# Patient Record
Sex: Female | Born: 1970 | Race: White | Hispanic: No | Marital: Married | Smoking: Never smoker
Health system: Southern US, Community
[De-identification: ages and names within clinical notes are randomized; demographics above are authoritative.]

## PROBLEM LIST (undated history)

## (undated) DIAGNOSIS — E559 Vitamin D deficiency, unspecified: Secondary | ICD-10-CM

## (undated) DIAGNOSIS — R6889 Other general symptoms and signs: Secondary | ICD-10-CM

## (undated) DIAGNOSIS — E079 Disorder of thyroid, unspecified: Secondary | ICD-10-CM

## (undated) DIAGNOSIS — E785 Hyperlipidemia, unspecified: Secondary | ICD-10-CM

## (undated) DIAGNOSIS — E663 Overweight: Secondary | ICD-10-CM

## (undated) DIAGNOSIS — N189 Chronic kidney disease, unspecified: Secondary | ICD-10-CM

## (undated) DIAGNOSIS — R87629 Unspecified abnormal cytological findings in specimens from vagina: Secondary | ICD-10-CM

## (undated) DIAGNOSIS — K5900 Constipation, unspecified: Secondary | ICD-10-CM

## (undated) HISTORY — DX: Disorder of thyroid, unspecified: E07.9

## (undated) HISTORY — DX: Chronic kidney disease, unspecified: N18.9

## (undated) HISTORY — PX: CHOLECYSTECTOMY: SHX55

## (undated) HISTORY — PX: MOUTH SURGERY: SHX715

## (undated) HISTORY — DX: Vitamin D deficiency, unspecified: E55.9

## (undated) HISTORY — DX: Unspecified abnormal cytological findings in specimens from vagina: R87.629

## (undated) HISTORY — PX: LEEP: SHX91

---
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
## 1997-07-29 ENCOUNTER — Other Ambulatory Visit: Admission: RE | Admit: 1997-07-29 | Discharge: 1997-07-29 | Payer: Self-pay | Admitting: Gynecology

## 1998-08-24 ENCOUNTER — Other Ambulatory Visit: Admission: RE | Admit: 1998-08-24 | Discharge: 1998-08-24 | Payer: Self-pay | Admitting: Gynecology

## 2000-07-20 ENCOUNTER — Other Ambulatory Visit: Admission: RE | Admit: 2000-07-20 | Discharge: 2000-07-20 | Payer: Self-pay | Admitting: Gynecology

## 2006-09-18 ENCOUNTER — Other Ambulatory Visit: Admission: RE | Admit: 2006-09-18 | Discharge: 2006-09-18 | Payer: Self-pay | Admitting: Obstetrics and Gynecology

## 2007-10-30 ENCOUNTER — Other Ambulatory Visit: Admission: RE | Admit: 2007-10-30 | Discharge: 2007-10-30 | Payer: Self-pay | Admitting: Obstetrics and Gynecology

## 2007-10-30 ENCOUNTER — Ambulatory Visit (HOSPITAL_COMMUNITY): Admission: RE | Admit: 2007-10-30 | Discharge: 2007-10-30 | Payer: Self-pay | Admitting: Obstetrics & Gynecology

## 2007-11-06 ENCOUNTER — Encounter (INDEPENDENT_AMBULATORY_CARE_PROVIDER_SITE_OTHER): Payer: Self-pay | Admitting: General Surgery

## 2007-11-06 ENCOUNTER — Ambulatory Visit (HOSPITAL_COMMUNITY): Admission: RE | Admit: 2007-11-06 | Discharge: 2007-11-07 | Payer: Self-pay | Admitting: General Surgery

## 2008-12-11 ENCOUNTER — Other Ambulatory Visit: Admission: RE | Admit: 2008-12-11 | Discharge: 2008-12-11 | Payer: Self-pay | Admitting: Obstetrics and Gynecology

## 2010-01-30 ENCOUNTER — Emergency Department (HOSPITAL_COMMUNITY)
Admission: EM | Admit: 2010-01-30 | Discharge: 2010-01-30 | Payer: Self-pay | Source: Home / Self Care | Admitting: Emergency Medicine

## 2010-01-30 LAB — COMPREHENSIVE METABOLIC PANEL
ALT: 73 U/L — ABNORMAL HIGH (ref 0–35)
AST: 144 U/L — ABNORMAL HIGH (ref 0–37)
Albumin: 3.8 g/dL (ref 3.5–5.2)
Alkaline Phosphatase: 85 U/L (ref 39–117)
BUN: 6 mg/dL (ref 6–23)
CO2: 24 mEq/L (ref 19–32)
Calcium: 8.9 mg/dL (ref 8.4–10.5)
Chloride: 103 mEq/L (ref 96–112)
Creatinine, Ser: 0.68 mg/dL (ref 0.4–1.2)
GFR calc Af Amer: >60 mL/min (ref >60)
GFR calc non Af Amer: >60 mL/min (ref >60)
Glucose, Bld: 99 mg/dL (ref 70–99)
Potassium: 3.3 mEq/L — ABNORMAL LOW (ref 3.5–5.1)
Sodium: 137 mEq/L (ref 135–145)
Total Bilirubin: 0.4 mg/dL (ref 0.3–1.2)
Total Protein: 7.3 g/dL (ref 6.0–8.3)

## 2010-01-30 LAB — URINALYSIS, ROUTINE W REFLEX MICROSCOPIC
Bilirubin Urine: NEGATIVE
Hgb urine dipstick: NEGATIVE
Ketones, ur: NEGATIVE mg/dL
Nitrite: NEGATIVE
Protein, ur: NEGATIVE mg/dL
Specific Gravity, Urine: <1.005 — ABNORMAL LOW (ref 1.005–1.030)
Urine Glucose, Fasting: NEGATIVE mg/dL
Urobilinogen, UA: 0.2 mg/dL (ref 0.0–1.0)
pH: 6 (ref 5.0–8.0)

## 2010-01-30 LAB — CBC
HCT: 39.7 % (ref 36.0–46.0)
Hemoglobin: 14 g/dL (ref 12.0–15.0)
MCH: 30.4 pg (ref 26.0–34.0)
MCHC: 35.3 g/dL (ref 30.0–36.0)
MCV: 86.1 fL (ref 78.0–100.0)
Platelets: 165 10*3/uL (ref 150–400)
RBC: 4.61 MIL/uL (ref 3.87–5.11)
RDW: 12.5 % (ref 11.5–15.5)
WBC: 7.1 10*3/uL (ref 4.0–10.5)

## 2010-01-30 LAB — DIFFERENTIAL
Basophils Absolute: 0 10*3/uL (ref 0.0–0.1)
Basophils Relative: 0 % (ref 0–1)
Eosinophils Absolute: 0.1 10*3/uL (ref 0.0–0.7)
Eosinophils Relative: 1 % (ref 0–5)
Lymphocytes Relative: 16 % (ref 12–46)
Lymphs Abs: 1.1 10*3/uL (ref 0.7–4.0)
Monocytes Absolute: 0.3 10*3/uL (ref 0.1–1.0)
Monocytes Relative: 4 % (ref 3–12)
Neutro Abs: 5.6 10*3/uL (ref 1.7–7.7)
Neutrophils Relative %: 79 % — ABNORMAL HIGH (ref 43–77)

## 2010-01-30 LAB — LIPASE, BLOOD: Lipase: 103 U/L — ABNORMAL HIGH (ref 11–59)

## 2010-01-30 LAB — PREGNANCY, URINE: Preg Test, Ur: NEGATIVE

## 2010-05-17 NOTE — Op Note (Signed)
Kimberly Brennan, Kimberly Brennan              ACCOUNT NO.:  [id number]   MEDICAL RECORD NO.:  [id number]          PATIENT TYPE:  OIB   LOCATION:  A332                          FACILITY:  [facility]   PHYSICIAN:  Barbaraann Barthel, M.D. DATE OF BIRTH:  01-01-1971   DATE OF PROCEDURE:  11/06/2007  DATE OF DISCHARGE:                               OPERATIVE REPORT   SURGEON:  Barbaraann Barthel, MD.   PREOPERATIVE DIAGNOSES:  Cholecystitis and cholelithiasis.   POSTOPERATIVE DIAGNOSES:  Cholecystitis and cholelithiasis.   PROCEDURE:  Laparoscopic cholecystectomy (no cholangiogram).   SPECIMEN:  Gallbladder with stones wound.   WOUND CLASSIFICATION:  Contaminated (minimal bile spillage).   NOTE:  This is a 40 year old white female who had recurrent episodes of  right upper quadrant pain and nausea.  Sonogram revealed cholelithiasis.  Her liver function studies and amylase were within normal limits  preoperatively.  We placed her on a restrictive diet and then planned  for elective surgery.  We discussed the surgery in detail discussing  complications not limited to, but including bleeding, infection, damage  to bile ducts, perforation of organs, transitory diarrhea, and the  possibility of that an open procedure may be needed.  Informed consent  was obtained.   GROSS OPERATIVE FINDINGS:  The patient had a very short cystic duct, a  slightly intrahepatic gallbladder, and a rather large stone embedded in  Hartmann's pouch.  The right upper quadrant, otherwise, laparoscopically  was within normal limits.   TECHNIQUE:  The patient was placed in the supine position and after the  adequate administration of general anesthesia via endotracheal  intubation, a Foley catheter was aseptically inserted.  The patient was  prepped with Betadine solution and draped in the usual manner.  A  periumbilical incision was carried out over the superior aspect of the  umbilicus.  The fascia was clearly seen and clamped  and elevated with a  sharp towel clip and then a Veress needle was inserted and confirmed the  position with a saline drop test.  The abdomen was then insufflated with  approximately 3.5 L of CO2, and a 11-mm cannula was placed in the  umbilical incision.  Then under direct vision, a 11-mm cannula was  placed in the epigastrium and two other 5-mm cannulas were placed to the  right upper quadrant laterally.  The gallbladder was grasped.  The  adhesions were taken down.  The cystic duct was clearly visualized.  It  was a short cystic duct with triply silver clipped on the side of the  common bile duct and singly silver clipped on the gallbladder.  There  was a little bit of oozing from this area where the bile was coming from  the gallbladder area where the remnant of the cystic duct was located.  Cystic duct was also clearly visualized and triply silver clipped and  divided, and the gallbladder was removed from the liver bed.  This was a  little tedious because it was slightly intrahepatic.  However, this was  done without any problems.  There was no spillage.  We then checked for  hemostasis.  This was deemed complete and the right upper quadrant was  then irrigated, and I elected to leave some Surgicel in the liver bed  and a Jackson-Pratt drain in the liver bed which exited through one of  the lateral port sites.  The abdomen was then desufflated and then I  approximated the fascia in the area of the umbilicus with 0 Polysorb  suture used, approximately 10 mL of 0.5% Sensorcaine around port sites  for postoperative comfort and the skin was approximated with a stapling  device.  The wound was sutured in place with 3-0 nylon suture.  Prior to  closure, all sponge, needle, and instrument counts were found to be  correct.  The estimated blood loss was minimal.  The patient received  650 mL of crystalloids intraoperatively.  There were no complications.      Barbaraann Barthel, M.D.   Electronically Signed     WB/MEDQ  D:  11/06/2007  T:  11/06/2007  Job:  [id number]   cc:   Tilda Burrow, M.D.  Fax: [phone]

## 2010-08-08 ENCOUNTER — Other Ambulatory Visit (HOSPITAL_COMMUNITY): Payer: Self-pay | Admitting: Family Medicine

## 2010-08-08 DIAGNOSIS — K859 Acute pancreatitis without necrosis or infection, unspecified: Secondary | ICD-10-CM

## 2010-08-11 ENCOUNTER — Ambulatory Visit (HOSPITAL_COMMUNITY)
Admission: RE | Admit: 2010-08-11 | Discharge: 2010-08-11 | Disposition: A | Discharge status: Home / Self Care | Payer: BC Managed Care – PPO | Source: Ambulatory Visit | Attending: Family Medicine | Admitting: Family Medicine

## 2010-08-11 ENCOUNTER — Encounter (HOSPITAL_COMMUNITY): Payer: Self-pay

## 2010-08-11 DIAGNOSIS — N200 Calculus of kidney: Secondary | ICD-10-CM | POA: Insufficient documentation

## 2010-08-11 DIAGNOSIS — Z09 Encounter for follow-up examination after completed treatment for conditions other than malignant neoplasm: Secondary | ICD-10-CM | POA: Insufficient documentation

## 2010-08-11 DIAGNOSIS — K859 Acute pancreatitis without necrosis or infection, unspecified: Secondary | ICD-10-CM

## 2010-08-11 DIAGNOSIS — Q619 Cystic kidney disease, unspecified: Secondary | ICD-10-CM | POA: Insufficient documentation

## 2010-10-04 LAB — DIFFERENTIAL
Basophils Absolute: 0
Basophils Absolute: 0
Basophils Relative: 0
Basophils Relative: 1
Eosinophils Absolute: 0
Eosinophils Absolute: 0
Eosinophils Relative: 0
Eosinophils Relative: 0
Lymphocytes Relative: 25
Lymphocytes Relative: 32
Lymphs Abs: 2
Lymphs Abs: 2.3
Monocytes Absolute: 0.4
Monocytes Absolute: 0.6
Monocytes Relative: 7
Monocytes Relative: 7
Neutro Abs: 3.7
Neutro Abs: 6.2
Neutrophils Relative %: 60
Neutrophils Relative %: 67

## 2010-10-04 LAB — BASIC METABOLIC PANEL
BUN: 3 — ABNORMAL LOW
CO2: 27
Calcium: 8.9
Chloride: 110
Creatinine, Ser: 0.74
GFR calc Af Amer: >60
GFR calc non Af Amer: >60
Glucose, Bld: 98
Potassium: 3.9
Sodium: 143

## 2010-10-04 LAB — CBC
HCT: 35.5 — ABNORMAL LOW
HCT: 39.4
Hemoglobin: 11.8 — ABNORMAL LOW
Hemoglobin: 13.1
MCHC: 33.2
MCHC: 33.3
MCV: 86.4
MCV: 86.4
Platelets: 221
Platelets: 239
RBC: 4.11
RBC: 4.56
RDW: 12.9
RDW: 13.1
WBC: 6.2
WBC: 9.2

## 2010-10-04 LAB — HEPATIC FUNCTION PANEL
ALT: 18
ALT: 41 — ABNORMAL HIGH
AST: 16
AST: 40 — ABNORMAL HIGH
Albumin: 3.2 — ABNORMAL LOW
Albumin: 4
Alkaline Phosphatase: 65
Alkaline Phosphatase: 75
Bilirubin, Direct: 0.1
Bilirubin, Direct: 0.2
Indirect Bilirubin: 0.3
Indirect Bilirubin: 0.4
Total Bilirubin: 0.5
Total Bilirubin: 0.5
Total Protein: 6
Total Protein: 6.8

## 2010-10-04 LAB — HCG, QUANTITATIVE, PREGNANCY: hCG, Beta Chain, Quant, S: <2

## 2010-10-04 LAB — AMYLASE: Amylase: 60

## 2014-01-29 ENCOUNTER — Emergency Department (HOSPITAL_COMMUNITY)
Admission: EM | Admit: 2014-01-29 | Discharge: 2014-01-29 | Disposition: A | Discharge status: Home / Self Care | Payer: BC Managed Care – PPO | Attending: Emergency Medicine | Admitting: Emergency Medicine

## 2014-01-29 ENCOUNTER — Encounter (HOSPITAL_COMMUNITY): Payer: Self-pay | Admitting: Emergency Medicine

## 2014-01-29 ENCOUNTER — Emergency Department (HOSPITAL_COMMUNITY): Payer: BC Managed Care – PPO

## 2014-01-29 DIAGNOSIS — R109 Unspecified abdominal pain: Secondary | ICD-10-CM | POA: Diagnosis present

## 2014-01-29 DIAGNOSIS — N200 Calculus of kidney: Secondary | ICD-10-CM

## 2014-01-29 DIAGNOSIS — Z9049 Acquired absence of other specified parts of digestive tract: Secondary | ICD-10-CM | POA: Insufficient documentation

## 2014-01-29 LAB — URINALYSIS, ROUTINE W REFLEX MICROSCOPIC
Glucose, UA: NEGATIVE mg/dL
Hgb urine dipstick: NEGATIVE
Ketones, ur: 40 mg/dL — AB
Nitrite: NEGATIVE
Specific Gravity, Urine: >1.03 — ABNORMAL HIGH (ref 1.005–1.030)
Urobilinogen, UA: 0.2 mg/dL (ref 0.0–1.0)
pH: 6 (ref 5.0–8.0)

## 2014-01-29 LAB — URINE MICROSCOPIC-ADD ON

## 2014-01-29 MED ORDER — ONDANSETRON HCL 4 MG/2ML IJ SOLN
4.0000 mg | Freq: Once | INTRAMUSCULAR | Status: AC
  Administered 2014-01-29: 4 mg via INTRAVENOUS

## 2014-01-29 MED ORDER — OXYCODONE-ACETAMINOPHEN 5-325 MG PO TABS: 1.0000 | ORAL_TABLET | Freq: Four times a day (QID) | ORAL | Status: DC | PRN

## 2014-01-29 MED ORDER — MORPHINE SULFATE 4 MG/ML IJ SOLN
4.0000 mg | Freq: Once | INTRAMUSCULAR | Status: AC
  Administered 2014-01-29: 4 mg via INTRAVENOUS
  Filled 2014-01-29: qty 1

## 2014-01-29 MED ORDER — KETOROLAC TROMETHAMINE 30 MG/ML IJ SOLN
30.0000 mg | Freq: Once | INTRAMUSCULAR | Status: AC
  Administered 2014-01-29: 30 mg via INTRAVENOUS
  Filled 2014-01-29: qty 1

## 2014-01-29 MED ORDER — ONDANSETRON HCL 4 MG/2ML IJ SOLN
INTRAMUSCULAR | Status: AC
  Administered 2014-01-29: 4 mg via INTRAVENOUS
  Filled 2014-01-29: qty 2

## 2014-01-29 NOTE — ED Notes (Signed)
Pt. C/o right flank pain starting yesterday afternoon. Pt. Reports difficulty urinating.

## 2014-01-29 NOTE — Discharge Instructions (Signed)
Percocet as prescribed as needed for pain.  Follow-up with urology if not improving in the next 3 days. Return to the ER if your symptoms substantially worsen or change.   Kidney Stones Kidney stones (urolithiasis) are deposits that form inside your kidneys. The intense pain is caused by the stone moving through the urinary tract. When the stone moves, the ureter goes into spasm around the stone. The stone is usually passed in the urine.  CAUSES   A disorder that makes certain neck glands produce too much parathyroid hormone (primary hyperparathyroidism).  A buildup of uric acid crystals, similar to gout in your joints.  Narrowing (stricture) of the ureter.  A kidney obstruction present at birth (congenital obstruction).  Previous surgery on the kidney or ureters.  Numerous kidney infections. SYMPTOMS   Feeling sick to your stomach (nauseous).  Throwing up (vomiting).  Blood in the urine (hematuria).  Pain that usually spreads (radiates) to the groin.  Frequency or urgency of urination. DIAGNOSIS   Taking a history and physical exam.  Blood or urine tests.  CT scan.  Occasionally, an examination of the inside of the urinary bladder (cystoscopy) is performed. TREATMENT   Observation.  Increasing your fluid intake.  Extracorporeal shock wave lithotripsy--This is a noninvasive procedure that uses shock waves to break up kidney stones.  Surgery may be needed if you have severe pain or persistent obstruction. There are various surgical procedures. Most of the procedures are performed with the use of small instruments. Only small incisions are needed to accommodate these instruments, so recovery time is minimized. The size, location, and chemical composition are all important variables that will determine the proper choice of action for you. Talk to your health care provider to better understand your situation so that you will minimize the risk of injury to yourself and  your kidney.  HOME CARE INSTRUCTIONS   Drink enough water and fluids to keep your urine clear or pale yellow. This will help you to pass the stone or stone fragments.  Strain all urine through the provided strainer. Keep all particulate matter and stones for your health care provider to see. The stone causing the pain may be as small as a grain of salt. It is very important to use the strainer each and every time you pass your urine. The collection of your stone will allow your health care provider to analyze it and verify that a stone has actually passed. The stone analysis will often identify what you can do to reduce the incidence of recurrences.  Only take over-the-counter or prescription medicines for pain, discomfort, or fever as directed by your health care provider.  Make a follow-up appointment with your health care provider as directed.  Get follow-up X-rays if required. The absence of pain does not always mean that the stone has passed. It may have only stopped moving. If the urine remains completely obstructed, it can cause loss of kidney function or even complete destruction of the kidney. It is your responsibility to make sure X-rays and follow-ups are completed. Ultrasounds of the kidney can show blockages and the status of the kidney. Ultrasounds are not associated with any radiation and can be performed easily in a matter of minutes. SEEK MEDICAL CARE IF:  You experience pain that is progressive and unresponsive to any pain medicine you have been prescribed. SEEK IMMEDIATE MEDICAL CARE IF:   Pain cannot be controlled with the prescribed medicine.  You have a fever or shaking chills.  The  severity or intensity of pain increases over 18 hours and is not relieved by pain medicine.  You develop a new onset of abdominal pain.  You feel faint or pass out.  You are unable to urinate. MAKE SURE YOU:   Understand these instructions.  Will watch your condition.  Will get help  right away if you are not doing well or get worse. Document Released: 12/19/2004 Document Revised: 08/21/2012 Document Reviewed: 05/22/2012 [facility] Patient Information 2015 [place], [place]. This information is not intended to replace advice given to you by your health care provider. Make sure you discuss any questions you have with your health care provider.

## 2014-01-29 NOTE — ED Provider Notes (Signed)
CSN: [id number]     Arrival date & time 01/29/14  0406 History   First MD Initiated Contact with Patient 01/29/14 [id number]     Chief Complaint  Patient presents with  . Flank Pain     (Consider location/radiation/quality/duration/timing/severity/associated sxs/prior Treatment) Patient is a 44 y.o. female presenting with flank pain. The history is provided by the patient.  Flank Pain This is a new problem. The current episode started yesterday. The problem occurs constantly. The problem has been rapidly worsening. Nothing aggravates the symptoms. Nothing relieves the symptoms. She has tried nothing for the symptoms. The treatment provided no relief.    History reviewed. No pertinent past medical history. Past Surgical History  Procedure Laterality Date  . Cholecystectomy     No family history on file. History  Substance Use Topics  . Smoking status: Never Smoker   . Smokeless tobacco: Not on file  . Alcohol Use: No   OB History    No data available     Review of Systems  Genitourinary: Positive for flank pain.  All other systems reviewed and are negative.     Allergies  Review of patient's allergies indicates not on file.  Home Medications   Prior to Admission medications   Not on File   BP 120/106 mmHg  Pulse 68  Temp(Src) 97.7 F (36.5 C) (Oral)  Resp 20  Ht 5' 5"  (1.651 m)  Wt 143 lb (64.864 kg)  BMI 23.80 kg/m2  SpO2 100%  LMP 01/23/2014 (Approximate) Physical Exam  Constitutional: She is oriented to person, place, and time. She appears well-developed and well-nourished. No distress.  HENT:  Head: Normocephalic and atraumatic.  Neck: Normal range of motion. Neck supple.  Cardiovascular: Normal rate and regular rhythm.  Exam reveals no gallop and no friction rub.   No murmur heard. Pulmonary/Chest: Effort normal and breath sounds normal. No respiratory distress. She has no wheezes.  Abdominal: Soft. Bowel sounds are normal. She exhibits no distension.  There is no tenderness.  There is tenderness to palpation in the right flank.  Musculoskeletal: Normal range of motion.  Neurological: She is alert and oriented to person, place, and time.  Skin: Skin is warm and dry. She is not diaphoretic.  Nursing note and vitals reviewed.   ED Course  Procedures (including critical care time) Labs Review Labs Reviewed  URINALYSIS, ROUTINE W REFLEX MICROSCOPIC - Abnormal; Notable for the following:    APPearance HAZY (*)    Specific Gravity, Urine >1.030 (*)    Bilirubin Urine SMALL (*)    Ketones, ur 40 (*)    Protein, ur TRACE (*)    Leukocytes, UA TRACE (*)    All other components within normal limits  URINE MICROSCOPIC-ADD ON - Abnormal; Notable for the following:    Squamous Epithelial / LPF MANY (*)    Bacteria, UA MANY (*)    All other components within normal limits    Imaging Review Ct Renal Stone Study  01/29/2014   CLINICAL DATA:  Right flank pain starting yesterday afternoon. Difficulty urinating.  EXAM: CT ABDOMEN AND PELVIS WITHOUT CONTRAST  TECHNIQUE: Multidetector CT imaging of the abdomen and pelvis was performed following the standard protocol without IV contrast.  COMPARISON:  08/11/2010  FINDINGS: Lung bases are clear.  Right-sided hydronephrosis and hydroureter with a tiny punctate stone measuring less than 2 mm demonstrated in the distal right ureter at the ureterovesical junction. No additional stones identified. Left kidney in ureter are normal. Bladder is decompressed.  The unenhanced appearance of the liver, spleen, pancreas, adrenal glands, abdominal aorta, inferior vena cava, and retroperitoneal lymph nodes is unremarkable. Surgical absence of the gallbladder. Stomach, small bowel, and colon are decompressed. No free air or free fluid in the abdomen.  Pelvis: Uterus and ovaries are not enlarged. No free or loculated pelvic fluid collections. No pelvic mass or lymphadenopathy. Appendix is not identified. No destructive bone  lesions.  IMPRESSION: Less than 2 mm stone demonstrated in the distal right ureter with moderate proximal obstruction.   Electronically Signed   By: Lucienne Capers M.D.   On: 01/29/2014 05:08     EKG Interpretation None      MDM   Final diagnoses:  None    CT scan shows a 47m stone in the distal UVJ with proximal hydronephrosis. She is feeling better with medications given in the ER and appears appropriate for discharge. To return as needed for fevers, worsening pain, or other new and concerning.    DVeryl Speak MD 01/29/14 [id number]

## 2014-02-17 ENCOUNTER — Ambulatory Visit (INDEPENDENT_AMBULATORY_CARE_PROVIDER_SITE_OTHER): Payer: BLUE CROSS/BLUE SHIELD | Admitting: Adult Health

## 2014-02-17 ENCOUNTER — Encounter: Payer: Self-pay | Admitting: Adult Health

## 2014-02-17 ENCOUNTER — Other Ambulatory Visit (HOSPITAL_COMMUNITY)
Admission: RE | Admit: 2014-02-17 | Discharge: 2014-02-17 | Disposition: A | Discharge status: Home / Self Care | Payer: BC Managed Care – PPO | Source: Ambulatory Visit | Attending: Adult Health | Admitting: Adult Health

## 2014-02-17 VITALS — BP 130/90 | HR 82 | Ht 63.25 in | Wt 141.0 lb

## 2014-02-17 DIAGNOSIS — Z1151 Encounter for screening for human papillomavirus (HPV): Secondary | ICD-10-CM | POA: Diagnosis present

## 2014-02-17 DIAGNOSIS — Z01419 Encounter for gynecological examination (general) (routine) without abnormal findings: Secondary | ICD-10-CM | POA: Insufficient documentation

## 2014-02-17 DIAGNOSIS — Z113 Encounter for screening for infections with a predominantly sexual mode of transmission: Secondary | ICD-10-CM | POA: Diagnosis present

## 2014-02-17 DIAGNOSIS — Z1212 Encounter for screening for malignant neoplasm of rectum: Secondary | ICD-10-CM

## 2014-02-17 LAB — HEMOCCULT GUIAC POC 1CARD (OFFICE): Fecal Occult Blood, POC: NEGATIVE

## 2014-02-17 NOTE — Patient Instructions (Signed)
Physical in 1 year Mammogram now and yearly [phone] Take folic acid

## 2014-02-17 NOTE — Progress Notes (Signed)
Patient ID: Kimberly Brennan, female   DOB: 12/12/1970, 44 y.o.   MRN: [id number] History of Present Illness: Kimberly Brennan is a 44 year old white female, married in for well woman gyn exam and pap.She wants STD testing has had another sex partner.   Current Medications, Allergies, Past Medical History, Past Surgical History, Family History and Social History were reviewed in [employer] record.     Review of Systems: Patient denies any headaches, hearing loss, fatigue, blurred vision, shortness of breath, chest pain, abdominal pain, problems with bowel movements, urination, or intercourse. No joint pain or mood swings.Periods are regular. Has lost weight on weight watchers.She is not on birth control and is [place] if gets pregnant.    Physical Exam:BP 130/90 mmHg  Pulse 82  Ht 5' 3.25" (1.607 m)  Wt 141 lb (63.957 kg)  BMI 24.77 kg/m2  LMP 01/23/2014 (Approximate) General:  Well developed, well nourished, no acute distress Skin:  Warm and dry Neck:  Midline trachea, normal thyroid, good ROM, no lymphadenopathy Lungs; Clear to auscultation bilaterally Breast:  No dominant palpable mass, retraction, or nipple discharge Cardiovascular: Regular rate and rhythm Abdomen:  Soft, non tender, no hepatosplenomegaly Pelvic:  External genitalia is normal in appearance, no lesions.  The vagina is normal in appearance, with good color, moisture and rugae. Urethra has no lesions or masses. The cervix is smooth, pap with HPV and GC/CHL performed.  Uterus is felt to be normal size, shape, and contour.  No adnexal masses or tenderness noted.Bladder is non tender, no masses felt. Rectal: Good sphincter tone, no polyps, or hemorrhoids felt.  Hemoccult negative. Extremities/musculoskeletal:  No swelling or varicosities noted, no clubbing or cyanosis Psych:  No mood changes, alert and cooperative,seems happy   Impression:  Well woman gyn exam with pap STD testing   Plan: Check HIV,RPR and  HSV 2 Check CBC,CMP,TSH and lipids and Vitamin D Take folic acid Physical in 1 year,pap in 3 years if normal with negative HPV Mammogram now and yearly,number given

## 2014-02-18 ENCOUNTER — Telehealth: Payer: Self-pay | Admitting: Adult Health

## 2014-02-18 NOTE — Telephone Encounter (Signed)
Pt aware of labs  

## 2014-02-19 LAB — CYTOLOGY - PAP

## 2014-02-23 ENCOUNTER — Telehealth: Payer: Self-pay | Admitting: Adult Health

## 2014-02-23 LAB — CBC
HCT: 43.6 % (ref 34.0–46.6)
Hemoglobin: 14.8 g/dL (ref 11.1–15.9)
MCH: 30.5 pg (ref 26.6–33.0)
MCHC: 33.9 g/dL (ref 31.5–35.7)
MCV: 90 fL (ref 79–97)
Platelets: 228 10*3/uL (ref 150–379)
RBC: 4.85 x10E6/uL (ref 3.77–5.28)
RDW: 13.5 % (ref 12.3–15.4)
WBC: 7.7 10*3/uL (ref 3.4–10.8)

## 2014-02-23 LAB — COMPREHENSIVE METABOLIC PANEL
ALT: 13 IU/L (ref 0–32)
AST: 7 IU/L (ref 0–40)
Albumin/Globulin Ratio: 1.6 (ref 1.1–2.5)
Albumin: 4.7 g/dL (ref 3.5–5.5)
Alkaline Phosphatase: 53 IU/L (ref 39–117)
BUN/Creatinine Ratio: 18 (ref 9–23)
BUN: 14 mg/dL (ref 6–24)
Bilirubin Total: 0.4 mg/dL (ref 0.0–1.2)
CO2: 24 mmol/L (ref 18–29)
Calcium: 10 mg/dL (ref 8.7–10.2)
Chloride: 100 mmol/L (ref 97–108)
Creatinine, Ser: 0.76 mg/dL (ref 0.57–1.00)
GFR calc Af Amer: 112 mL/min/{1.73_m2} (ref >59)
GFR calc non Af Amer: 97 mL/min/{1.73_m2} (ref >59)
Globulin, Total: 2.9 g/dL (ref 1.5–4.5)
Glucose: 94 mg/dL (ref 65–99)
Potassium: 3.9 mmol/L (ref 3.5–5.2)
Sodium: 140 mmol/L (ref 134–144)
Total Protein: 7.6 g/dL (ref 6.0–8.5)

## 2014-02-23 LAB — VITAMIN D 1,25 DIHYDROXY
Vitamin D 1, 25 (OH)2 Total: 71 pg/mL
Vitamin D2 1, 25 (OH)2: <10 pg/mL
Vitamin D3 1, 25 (OH)2: 71 pg/mL

## 2014-02-23 LAB — TSH: TSH: 2 u[IU]/mL (ref 0.450–4.500)

## 2014-02-23 LAB — LIPID PANEL
Chol/HDL Ratio: 3.8 ratio units (ref 0.0–4.4)
Cholesterol, Total: 219 mg/dL — ABNORMAL HIGH (ref 100–199)
HDL: 58 mg/dL (ref >39)
LDL Calculated: 140 mg/dL — ABNORMAL HIGH (ref 0–99)
Triglycerides: 107 mg/dL (ref 0–149)
VLDL Cholesterol Cal: 21 mg/dL (ref 5–40)

## 2014-02-23 LAB — HSV 2 ANTIBODY, IGG: HSV 2 Glycoprotein G Ab, IgG: <0.91 index (ref 0.00–0.90)

## 2014-02-23 LAB — HIV ANTIBODY (ROUTINE TESTING W REFLEX): HIV Screen 4th Generation wRfx: NONREACTIVE

## 2014-02-23 LAB — RPR: RPR Ser Ql: NONREACTIVE

## 2014-02-23 NOTE — Telephone Encounter (Signed)
Pt aware labs good and pap was normal with negative HPV and GC/CHL, increase activity

## 2016-09-21 ENCOUNTER — Encounter (HOSPITAL_COMMUNITY): Payer: Self-pay | Admitting: *Deleted

## 2016-09-21 ENCOUNTER — Emergency Department (HOSPITAL_COMMUNITY): Payer: Worker's Compensation

## 2016-09-21 ENCOUNTER — Emergency Department (HOSPITAL_COMMUNITY)
Admission: EM | Admit: 2016-09-21 | Discharge: 2016-09-21 | Disposition: A | Discharge status: Home / Self Care | Payer: Worker's Compensation | Attending: Emergency Medicine | Admitting: Emergency Medicine

## 2016-09-21 DIAGNOSIS — Z23 Encounter for immunization: Secondary | ICD-10-CM | POA: Diagnosis not present

## 2016-09-21 DIAGNOSIS — Y929 Unspecified place or not applicable: Secondary | ICD-10-CM | POA: Insufficient documentation

## 2016-09-21 DIAGNOSIS — Z9049 Acquired absence of other specified parts of digestive tract: Secondary | ICD-10-CM | POA: Diagnosis not present

## 2016-09-21 DIAGNOSIS — Y9389 Activity, other specified: Secondary | ICD-10-CM | POA: Insufficient documentation

## 2016-09-21 DIAGNOSIS — N189 Chronic kidney disease, unspecified: Secondary | ICD-10-CM | POA: Diagnosis not present

## 2016-09-21 DIAGNOSIS — W3182XA Contact with other commercial machinery, initial encounter: Secondary | ICD-10-CM | POA: Diagnosis not present

## 2016-09-21 DIAGNOSIS — S60940A Unspecified superficial injury of right index finger, initial encounter: Secondary | ICD-10-CM | POA: Insufficient documentation

## 2016-09-21 DIAGNOSIS — Y990 Civilian activity done for income or pay: Secondary | ICD-10-CM | POA: Insufficient documentation

## 2016-09-21 DIAGNOSIS — S6991XA Unspecified injury of right wrist, hand and finger(s), initial encounter: Secondary | ICD-10-CM

## 2016-09-21 MED ORDER — TETANUS-DIPHTH-ACELL PERTUSSIS 5-2.5-18.5 LF-MCG/0.5 IM SUSP
0.5000 mL | Freq: Once | INTRAMUSCULAR | Status: AC
  Administered 2016-09-21: 0.5 mL via INTRAMUSCULAR
  Filled 2016-09-21: qty 0.5

## 2016-09-21 NOTE — Discharge Instructions (Signed)
You were seen in the ED today after a finger injury. Follow up with your PCP and the hand surgeon listed. There could be some fingernail deformity as a result of this injury. Change the dressing daily and keep the dressing clean and dry.

## 2016-09-21 NOTE — ED Triage Notes (Signed)
Pt got her right hand middle finger caught in a metal fan. Pt got tip of the skin on her finger taken off. NAD noted

## 2016-09-21 NOTE — ED Provider Notes (Signed)
Emergency Department Provider Note   I have reviewed the triage vital signs and the nursing notes.   HISTORY  Chief Complaint Finger Injury   HPI Kimberly Brennan is a 46 y.o. female presents to the emergency department for evaluation of right index finger injury. Patient states she was grabbing a fan when her hand slipped through the protective cage and the blade struck her right index finger. She had some bleeding in the area and states that her coworkers insisted that she go to the emergency department for further evaluation. She is right-handed. No injury to other fingers. Bleeding controlled with direct pressure.   Past Medical History:  Diagnosis Date  . Chronic kidney disease    kidney stone  . Vaginal Pap smear, abnormal     There are no active problems to display for this patient.   Past Surgical History:  Procedure Laterality Date  . CHOLECYSTECTOMY    . LEEP    . MOUTH SURGERY        Allergies Patient has no known allergies.  Family History  Problem Relation Age of Onset  . Adopted: Yes    Social History Social History  Substance Use Topics  . Smoking status: Never Smoker  . Smokeless tobacco: Never Used  . Alcohol use No    Review of Systems  Constitutional: No fever/chills Eyes: No visual changes. ENT: No sore throat. Cardiovascular: Denies chest pain. Respiratory: Denies shortness of breath. Gastrointestinal: No abdominal pain.  No nausea, no vomiting.  No diarrhea.  No constipation. Genitourinary: Negative for dysuria. Musculoskeletal: Negative for back pain. Skin: Negative for rash. Injury to the right index finger.  Neurological: Negative for headaches, focal weakness or numbness.  10-point ROS otherwise negative.  ____________________________________________   PHYSICAL EXAM:  VITAL SIGNS: ED Triage Vitals  Enc Vitals Group     BP 09/21/16 1813 (!) 147/97     Pulse Rate 09/21/16 1813 (!) 107     Resp 09/21/16 1813 18   Temp 09/21/16 1813 98.6 F (37 C)     Temp Source 09/21/16 1813 Oral     SpO2 09/21/16 1813 98 %     Weight 09/21/16 1814 141 lb (64 kg)     Height 09/21/16 1814 5' 3"  (1.6 m)     Pain Score 09/21/16 1813 5   Constitutional: Alert and oriented. Well appearing and in no acute distress. Eyes: Conjunctivae are normal. Head: Atraumatic. Nose: No congestion/rhinnorhea. Mouth/Throat: Mucous membranes are moist.  Neck: No stridor.  Cardiovascular: Good peripheral circulation.    Respiratory: Normal respiratory effort.  Gastrointestinal: No distention.  Musculoskeletal: No lower extremity tenderness nor edema. No gross deformities of extremities. Neurologic:  Normal speech and language. No gross focal neurologic deficits are appreciated.  Skin:  Skin is warm and dry. Tissue avulsion along the distal, lateral aspect of the right index finger involving the distal nail but not the germinal matrix. Minor nailbed avulsion but no laceration to repair.    ____________________________________________  RADIOLOGY  Dg Hand Complete Right  Result Date: 09/21/2016 CLINICAL DATA:  Index finger laceration EXAM: RIGHT HAND - COMPLETE 3+ VIEW COMPARISON:  None. FINDINGS: There is no evidence of fracture or dislocation. There is no evidence of arthropathy or other focal bone abnormality. Soft tissues are unremarkable. IMPRESSION: Negative. Electronically Signed   By: Donavan Foil M.D.   On: 09/21/2016 19:59    ____________________________________________   PROCEDURES  Procedure(s) performed:   Procedures  None ____________________________________________   INITIAL IMPRESSION /  ASSESSMENT AND PLAN / ED COURSE  Pertinent labs & imaging results that were available during my care of the patient were reviewed by me and considered in my medical decision making (see chart for details).  Patient has a distal avulsion injury of the right index finger distal nail but not the germinal matrix. Plan to  clean the wound and dress. No nailbed repair with avulsion type injury. No visible bone. Updated tetanus.   Wound cleaned and dressed with plan for wet to dry dressings along with PCP and ortho/hand follow up PRN. Discussed the possibility of abnormal nail growth with the patient but given the location I am hopeful for cosmetically acceptable healing.   At this time, I do not feel there is any life-threatening condition present. I have reviewed and discussed all results (EKG, imaging, lab, urine as appropriate), exam findings with patient. I have reviewed nursing notes and appropriate previous records.  I feel the patient is safe to be discharged home without further emergent workup. Discussed usual and customary return precautions. Patient and family (if present) verbalize understanding and are comfortable with this plan.  Patient will follow-up with their primary care provider. If they do not have a primary care provider, information for follow-up has been provided to them. All questions have been answered.  ____________________________________________  FINAL CLINICAL IMPRESSION(S) / ED DIAGNOSES  Final diagnoses:  Injury of finger of right hand, initial encounter     MEDICATIONS GIVEN DURING THIS VISIT:  Medications  Tdap (BOOSTRIX) injection 0.5 mL (0.5 mLs Intramuscular Given 09/21/16 2014)     NEW OUTPATIENT MEDICATIONS STARTED DURING THIS VISIT:  There are no discharge medications for this patient.   Note:  This document was prepared using Dragon voice recognition software and may include unintentional dictation errors.  Nanda Quinton, MD Emergency Medicine    , [facility], MD 09/22/16 1054

## 2016-09-28 ENCOUNTER — Telehealth: Payer: Self-pay | Admitting: Orthopaedic Surgery

## 2016-09-28 NOTE — Telephone Encounter (Signed)
I spoke to Kimberly Brennan this morning regarding her [facility] appointment this coming Wednesday.  She said per her WC she is to go to her PCP this morning and depending on what is said there, she will call me back regarding the follow up appointment here on Wednesday.  Kimberly Brennan did call back while we were at lunch and left a message on the answering machine stating to cancel the appointment for this coming Wednesday with Dr. Luna Glasgow.  Appointment has been canceled.

## 2016-10-04 ENCOUNTER — Ambulatory Visit: Payer: Worker's Compensation | Admitting: Orthopaedic Surgery

## 2017-12-20 ENCOUNTER — Other Ambulatory Visit (HOSPITAL_COMMUNITY): Payer: Self-pay | Admitting: Nurse Practitioner

## 2017-12-20 DIAGNOSIS — Z1231 Encounter for screening mammogram for malignant neoplasm of breast: Secondary | ICD-10-CM

## 2017-12-31 ENCOUNTER — Ambulatory Visit (HOSPITAL_COMMUNITY)
Admission: RE | Admit: 2017-12-31 | Discharge: 2017-12-31 | Disposition: A | Discharge status: Home / Self Care | Payer: BC Managed Care – PPO | Source: Ambulatory Visit | Attending: Nurse Practitioner | Admitting: Nurse Practitioner

## 2017-12-31 DIAGNOSIS — Z1231 Encounter for screening mammogram for malignant neoplasm of breast: Secondary | ICD-10-CM

## 2018-01-24 ENCOUNTER — Encounter: Payer: Self-pay | Admitting: Adult Health

## 2018-01-24 ENCOUNTER — Other Ambulatory Visit (HOSPITAL_COMMUNITY)
Admission: RE | Admit: 2018-01-24 | Discharge: 2018-01-24 | Disposition: A | Discharge status: Home / Self Care | Payer: BC Managed Care – PPO | Source: Ambulatory Visit | Attending: Adult Health | Admitting: Adult Health

## 2018-01-24 ENCOUNTER — Ambulatory Visit: Payer: BC Managed Care – PPO | Admitting: Adult Health

## 2018-01-24 VITALS — BP 127/84 | HR 78 | Ht 66.0 in | Wt 165.0 lb

## 2018-01-24 DIAGNOSIS — Z1211 Encounter for screening for malignant neoplasm of colon: Secondary | ICD-10-CM | POA: Diagnosis not present

## 2018-01-24 DIAGNOSIS — Z1212 Encounter for screening for malignant neoplasm of rectum: Secondary | ICD-10-CM

## 2018-01-24 DIAGNOSIS — Z01419 Encounter for gynecological examination (general) (routine) without abnormal findings: Secondary | ICD-10-CM

## 2018-01-24 LAB — HEMOCCULT GUIAC POC 1CARD (OFFICE): Fecal Occult Blood, POC: NEGATIVE

## 2018-01-24 NOTE — Progress Notes (Signed)
Patient ID: Kimberly Brennan, female   DOB: March 19, 1970, 48 y.o.   MRN: [id number] History of Present Illness: Kimberly Brennan is a 48 year old white female, married, in for pap and pelvic exam,she had physical and labs with PCP in December and vitamin D was 18 and started on vitamin D and feels better. She has history of abnormal pap, e years ago. Last pap was 02/17/14, negative for malignancy and HPV and GC/CHl.  PCP is Dr Anastasio Champion.    Current Medications, Allergies, Past Medical History, Past Surgical History, Family History and Social History were reviewed in [employer] record.     Review of Systems: Patient denies any headaches, hearing loss, fatigue, blurred vision, shortness of breath, chest pain, abdominal pain, problems with bowel movements, urination, or intercourse. No joint pain or mood swings. Had some depression but since started taking Vitamin D is good. Periods regular    Physical Exam:BP 127/84 (BP Location: Left Arm, Patient Position: Sitting, Cuff Size: Normal)   Pulse 78   Ht 5' 6"  (1.676 m)   Wt 165 lb (74.8 kg)   LMP 01/16/2018   BMI 26.63 kg/m  General:  Well developed, well nourished, no acute distress Skin:  Warm and dry Pelvic:  External genitalia is normal in appearance, no lesions.  The vagina is normal in appearance. Urethra has no lesions or masses. The cervix is smooth, pap with HPV performed.  Uterus is felt to be normal size, shape, and contour.  No adnexal masses or tenderness noted.Bladder is non tender, no masses felt. Rectal: Good sphincter tone, no polyps, or hemorrhoids felt.  Hemoccult negative. Psych:  No mood changes, alert and cooperative,seems happy Fall risk is low. PHQ 2 score 0. Examination chaperoned by Diona Fanti CMA.  Impression: 1. Encounter for gynecological examination with Papanicolaou smear of cervix   2. Screening for colorectal cancer       Plan: Pap with HPV sent Pap in 3 years if normal Physical and labs  with PCP Mammogram yearly

## 2018-01-24 NOTE — Addendum Note (Signed)
Addended by: Diona Fanti A on: 01/24/2018 11:44 AM   Modules accepted: Orders

## 2018-01-28 LAB — CYTOLOGY - PAP
Diagnosis: NEGATIVE
HPV: NOT DETECTED

## 2018-04-22 ENCOUNTER — Ambulatory Visit (INDEPENDENT_AMBULATORY_CARE_PROVIDER_SITE_OTHER): Payer: Self-pay | Admitting: Internal Medicine

## 2018-07-17 ENCOUNTER — Other Ambulatory Visit: Payer: Self-pay

## 2018-07-17 DIAGNOSIS — Z20828 Contact with and (suspected) exposure to other viral communicable diseases: Secondary | ICD-10-CM

## 2018-07-17 DIAGNOSIS — Z20822 Contact with and (suspected) exposure to covid-19: Secondary | ICD-10-CM

## 2018-07-21 LAB — NOVEL CORONAVIRUS, NAA: SARS-CoV-2, NAA: NOT DETECTED

## 2018-09-04 ENCOUNTER — Ambulatory Visit (INDEPENDENT_AMBULATORY_CARE_PROVIDER_SITE_OTHER): Payer: BC Managed Care – PPO | Admitting: Internal Medicine

## 2018-10-01 ENCOUNTER — Ambulatory Visit (INDEPENDENT_AMBULATORY_CARE_PROVIDER_SITE_OTHER): Payer: BC Managed Care – PPO | Admitting: Internal Medicine

## 2018-10-02 ENCOUNTER — Encounter (INDEPENDENT_AMBULATORY_CARE_PROVIDER_SITE_OTHER): Payer: Self-pay | Admitting: Internal Medicine

## 2018-10-02 ENCOUNTER — Ambulatory Visit (INDEPENDENT_AMBULATORY_CARE_PROVIDER_SITE_OTHER): Payer: BC Managed Care – PPO | Admitting: Internal Medicine

## 2018-10-02 ENCOUNTER — Other Ambulatory Visit: Payer: Self-pay

## 2018-10-02 DIAGNOSIS — E782 Mixed hyperlipidemia: Secondary | ICD-10-CM | POA: Diagnosis not present

## 2018-10-02 DIAGNOSIS — E559 Vitamin D deficiency, unspecified: Secondary | ICD-10-CM | POA: Diagnosis not present

## 2018-10-02 DIAGNOSIS — K5900 Constipation, unspecified: Secondary | ICD-10-CM

## 2018-10-02 DIAGNOSIS — R6889 Other general symptoms and signs: Secondary | ICD-10-CM | POA: Diagnosis not present

## 2018-10-02 DIAGNOSIS — E663 Overweight: Secondary | ICD-10-CM

## 2018-10-02 DIAGNOSIS — E785 Hyperlipidemia, unspecified: Secondary | ICD-10-CM | POA: Insufficient documentation

## 2018-10-02 HISTORY — DX: Hyperlipidemia, unspecified: E78.5

## 2018-10-02 HISTORY — DX: Other general symptoms and signs: R68.89

## 2018-10-02 HISTORY — DX: Constipation, unspecified: K59.00

## 2018-10-02 HISTORY — DX: Vitamin D deficiency, unspecified: E55.9

## 2018-10-02 HISTORY — DX: Overweight: E66.3

## 2018-10-02 NOTE — Patient Instructions (Signed)
[facility] Dietary Recommendations for Weight Loss What to Avoid . Avoid added sugars o Often added sugar can be found in processed foods such as many condiments, dry cereals, cakes, cookies, chips, crisps, crackers, candies, sweetened drinks, etc.  o Read labels and AVOID/DECREASE use of foods with the following in their ingredient list: Sugar, fructose, high fructose corn syrup, sucrose, glucose, maltose, dextrose, molasses, cane sugar, brown sugar, any type of syrup, agave nectar, etc.   . Avoid snacking in between meals . Avoid foods made with flour o If you are going to eat food made with flour, choose those made with whole-grains; and, minimize your consumption as much as is tolerable . Avoid processed foods o These foods are generally stocked in the middle of the grocery store. Focus on shopping on the perimeter of the grocery.  What to Include . Vegetables o GREEN LEAFY VEGETABLES: Kale, spinach, mustard greens, collard greens, cabbage, broccoli, etc. o OTHER: Asparagus, cauliflower, eggplant, carrots, peas, Brussel sprouts, tomatoes, bell peppers, zucchini, beets, cucumbers, etc. . Grains, seeds, and legumes o Beans: kidney beans, black eyed peas, garbanzo beans, black beans, pinto beans, etc. o Whole, unrefined grains: brown rice, barley, bulgur, oatmeal, etc. . Healthy fats  o Avoid highly processed fats such as vegetable oil o Examples of healthy fats: avocado, olives, virgin olive oil, dark chocolate (?72% Cocoa), nuts (peanuts, almonds, walnuts, cashews, pecans, etc.) . Low - Moderate Intake of Animal Sources of Protein o Meat sources: chicken, [place], salmon, tuna. Limit to 4 ounces of meat at one time. o Consider limiting dairy sources, but when choosing dairy focus on: PLAIN [place] yogurt, cottage cheese, high-protein milk . Fruit o Choose berries  When to Eat . Intermittent Fasting: o Choosing not to eat for a specific time period, but DO FOCUS ON HYDRATION  when fasting o Multiple Techniques: - Time Restricted Eating: eat 3 meals in a day, each meal lasting no more than 60 minutes, no snacks between meals - 16-18 hour fast: fast for 16 to 18 hours up to 7 days a week. Often suggested to start with 2-3 nonconsecutive days per week.  . Remember the time you sleep is counted as fasting.  . Examples of eating schedule: Fast from 7:00pm-11:00am. Eat between 11:00am-7:00pm.  - 24-hour fast: fast for 24 hours up to every other day. Often suggested to start with 1 day per week . Remember the time you sleep is counted as fasting . Examples of eating schedule:  o Eating day: eat 2-3 meals on your eating day. If doing 2 meals, each meal should last no more than 90 minutes. If doing 3 meals, each meal should last no more than 60 minutes. Finish last meal by 7:00pm. o Fasting day: Fast until 7:00pm.  o IF YOU FEEL UNWELL FOR ANY REASON/IN ANY WAY WHEN FASTING, STOP FASTING BY EATING A NUTRITIOUS SNACK OR LIGHT MEAL o ALWAYS FOCUS ON HYDRATION DURING FASTS - Acceptable Hydration sources: water, broths, tea/coffee (black tea/coffee is best but using a small amount of whole-fat dairy products in coffee/tea is acceptable).  - Poor Hydration Sources: anything with sugar or artificial sweeteners added to it  These recommendations have been developed for patients that are actively receiving medical care from either Dr. Anastasio Champion or Jeralyn Ruths, DNP, NP-C at [facility]. These recommendations are developed for patients with specific medical conditions and are not meant to be distributed or used by others that are not actively receiving care from either provider listed  above at [facility]. It is not appropriate to participate in the above eating plans without proper medical supervision.   Reference: Rexanne Mano. The obesity code. [place]; 2016.

## 2018-10-02 NOTE — Progress Notes (Signed)
Wellness Office Visit  Subjective:  Patient ID: Kimberly Brennan, female    DOB: 1970-08-14  Age: 48 y.o. MRN: [id number]  CC: This lady comes in for follow-up of vitamin D deficiency, hyperlipidemia, overweight state, symptoms relating to thyroid deficiency with cold intolerance and constipation. HPI  Since she started on Armour Thyroid, she does feel better.  Her constipation has improved to some degree but her cold intolerance is significantly improved.  She also feels that she has more energy and focus and concentration.  She is tolerating the medication without any side effects. She also has been taking vitamin D3 5000 units daily.  She is tolerating this.  Past Medical History:  Diagnosis Date  . Cold intolerance 10/02/2018  . Constipation 10/02/2018  . HLD (hyperlipidemia) 10/02/2018  . Overweight (BMI 25.0-29.9) 10/02/2018  . Vaginal Pap smear, abnormal   . 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 Married for x 1 year. No children.     Current Meds  Medication Sig  . Cholecalciferol (VITAMIN D-3) 125 MCG (5000 UT) TABS Take 1 tablet by mouth daily.  Marland Kitchen thyroid (ARMOUR) 30 MG tablet Take 30 mg by mouth daily before breakfast.     Nutrition  She has been doing intermittent fasting usually 15 to 16 hours on most days of the week.  She is trying to eat healthier and wanted to know more information about nutrition in general today. Sleep  6-7 uninterrupted sleep.  Exercise  None. Bio Identical Hormones  This patient is being treated with desiccated thyroid, off label, for symptoms of thyroid deficiency.  The patient has been counseled regarding side effects and how to deal with them.  Objective:   Today's Vitals: BP 130/90   Pulse 72   Ht  5\' 6"  (1.676 m)   Wt 166 lb 9.6 oz (75.6 kg)   BMI 26.89 kg/m  Vitals with BMI 10/02/2018 01/24/2018 09/21/2016  Height 5\' 6"  5\' 6"  5\' 3"   Weight 166 lbs 10 oz 165 lbs 141 lbs  BMI 26.9 26.64 24.98  Systolic 130 127 865  Diastolic 90 84 97  Pulse 72 78 107     Physical Exam   She looks systemically well.  Her weight is stable.  Diastolic blood pressure somewhat elevated today.  We will need to keep a close monitor on this.  She is alert and orientated without any focal neurological signs.    Assessment   1. Mixed hyperlipidemia   2. Vitamin D deficiency disease   3. Cold intolerance   4. Constipation, unspecified constipation type   5. Overweight (BMI 25.0-29.9)       Tests ordered Orders Placed This Encounter  Procedures  . COMPLETE METABOLIC PANEL WITH GFR  . T3, free  . TSH  . VITAMIN D 25 Hydroxy (Vit-D Deficiency, Fractures)     Plan: 1. Blood was ordered as above to see if we need to adjust doses of thyroid or vitamin D. 2. She will continue with all medications for chronic conditions above. 3. I spent at least 15 minutes today discussing nutrition and we discussed the importance of lowering insulin levels, healthier foods such as green leafy vegetables and we also discussed animal protein and my preference for fish.  We  discussed specific foods that she may want to eat. 4. I will see her in about 6 weeks time for follow-up to see how she is doing and we will likely need to do blood work again.     Wilson Singer, MD

## 2018-10-03 LAB — COMPLETE METABOLIC PANEL WITH GFR
AG Ratio: 1.5 (calc) (ref 1.0–2.5)
ALT: 9 U/L (ref 6–29)
AST: 11 U/L (ref 10–35)
Albumin: 4.1 g/dL (ref 3.6–5.1)
Alkaline phosphatase (APISO): 49 U/L (ref 31–125)
BUN: 10 mg/dL (ref 7–25)
CO2: 27 mmol/L (ref 20–32)
Calcium: 9.6 mg/dL (ref 8.6–10.2)
Chloride: 104 mmol/L (ref 98–110)
Creat: 0.7 mg/dL (ref 0.50–1.10)
GFR, Est African American: 120 mL/min/{1.73_m2} (ref >=60)
GFR, Est Non African American: 104 mL/min/{1.73_m2} (ref >=60)
Globulin: 2.7 g/dL (calc) (ref 1.9–3.7)
Glucose, Bld: 99 mg/dL (ref 65–99)
Potassium: 4.3 mmol/L (ref 3.5–5.3)
Sodium: 140 mmol/L (ref 135–146)
Total Bilirubin: 0.5 mg/dL (ref 0.2–1.2)
Total Protein: 6.8 g/dL (ref 6.1–8.1)

## 2018-10-03 LAB — T3, FREE: T3, Free: 4.1 pg/mL (ref 2.3–4.2)

## 2018-10-03 LAB — TSH: TSH: 1.92 mIU/L

## 2018-10-03 LAB — VITAMIN D 25 HYDROXY (VIT D DEFICIENCY, FRACTURES): Vit D, 25-Hydroxy: 51 ng/mL (ref 30–100)

## 2018-10-03 NOTE — Progress Notes (Signed)
Your thyroid test is improved significantly and I would probably continue with the same dose of thyroid for the time being.  However, your vitamin D level needs to be higher so I would recommend that you take vitamin D3 a dose of 10,000 units daily.  If you have any questions, please do not hesitate to contact me through my chart.  I will see you next time, be well!

## 2018-10-23 ENCOUNTER — Ambulatory Visit (INDEPENDENT_AMBULATORY_CARE_PROVIDER_SITE_OTHER): Payer: BC Managed Care – PPO | Admitting: Internal Medicine

## 2018-11-14 ENCOUNTER — Other Ambulatory Visit (INDEPENDENT_AMBULATORY_CARE_PROVIDER_SITE_OTHER): Payer: Self-pay | Admitting: Internal Medicine

## 2018-11-14 ENCOUNTER — Other Ambulatory Visit: Payer: Self-pay

## 2018-11-14 ENCOUNTER — Ambulatory Visit (INDEPENDENT_AMBULATORY_CARE_PROVIDER_SITE_OTHER): Payer: BC Managed Care – PPO | Admitting: Internal Medicine

## 2018-11-14 ENCOUNTER — Encounter (INDEPENDENT_AMBULATORY_CARE_PROVIDER_SITE_OTHER): Payer: Self-pay | Admitting: Internal Medicine

## 2018-11-14 VITALS — BP 120/80 | HR 72 | Ht 66.0 in | Wt 163.4 lb

## 2018-11-14 DIAGNOSIS — E782 Mixed hyperlipidemia: Secondary | ICD-10-CM

## 2018-11-14 DIAGNOSIS — E663 Overweight: Secondary | ICD-10-CM

## 2018-11-14 DIAGNOSIS — E559 Vitamin D deficiency, unspecified: Secondary | ICD-10-CM | POA: Diagnosis not present

## 2018-11-14 NOTE — Progress Notes (Signed)
Metrics: Intervention Frequency ACO  Documented Smoking Status Yearly  Screened one or more times in 24 months  Cessation Counseling or  Active cessation medication Past 24 months  Past 24 months   Guideline developer: UpToDate (See UpToDate for funding source) Date Released: 2014       Wellness Office Visit  Subjective:  Patient ID: Kimberly Brennan, female    DOB: [birth year]  Age: 48 y.o. MRN: [id number]  CC: This lady comes in for follow-up of vitamin D deficiency, hyperlipidemia and symptoms of thyroid deficiency. HPI  When I had seen her back in September, her blood work showed reasonably good free T3 levels so she will continue with the same dose of desiccated thyroid. She has increased the dose of vitamin D3 to 10,000 units daily. She has been eating better and has been doing intermittent fasting and as a result has lost weight. In the past, she did have hyperlipidemia which thankfully did not require statin therapy.  She has no history of coronary artery disease or cerebrovascular disease. She denies any chest pain, dyspnea, palpitations or limb weakness Past Medical History:  Diagnosis Date  . Cold intolerance 10/02/2018  . Constipation 10/02/2018  . HLD (hyperlipidemia) 10/02/2018  . Overweight (BMI 25.0-29.9) 10/02/2018  . Vaginal Pap smear, abnormal   . 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 Married for x 1 year. No children.   Social History   Tobacco Use  . Smoking status: Never Smoker  . Smokeless tobacco: Never Used  Substance Use Topics  . Alcohol use: No    Current Meds  Medication Sig  . Cholecalciferol (VITAMIN D-3) 125 MCG (5000 UT) TABS Take 2 tablets by mouth daily.   Marland Kitchen thyroid (ARMOUR) 30 MG tablet Take 30 mg by mouth daily  before breakfast.       Objective:   Today's Vitals: BP 120/80   Pulse 72   Ht 5' 6"  (1.676 m)   Wt 163 lb 6.4 oz (74.1 kg)   BMI 26.37 kg/m  Vitals with BMI 11/14/2018 10/02/2018 01/24/2018  Height 5' 6"  5' 6"  5' 6"   Weight 163 lbs 6 oz 166 lbs 10 oz 165 lbs  BMI 26.39 84.1 66.06  Systolic 301 601 093  Diastolic 80 90 84  Pulse 72 72 78     Physical Exam   She looks systemically well.  She has lost 3 pounds since the last time I saw her.  No other new physical findings.    Assessment   1. Vitamin D deficiency disease   2. Overweight (BMI 25.0-29.9)   3. Mixed hyperlipidemia       Tests ordered Orders Placed This Encounter  Procedures  . Lipid Panel  . Vitamin D, 25-hydroxy  . CMP with eGFR([facility])     Plan: 1. Blood work is ordered as above. 2. She will continue vitamin D3 10,000 units daily. 3. Further recommendations will depend on blood results and I will see her in January for annual physical exam which she is overdue for.  I did offer influenza vaccination but she declined today.   No orders of the defined types were placed in this encounter.   Doree Albee, MD

## 2018-11-15 ENCOUNTER — Other Ambulatory Visit (INDEPENDENT_AMBULATORY_CARE_PROVIDER_SITE_OTHER): Payer: Self-pay | Admitting: Internal Medicine

## 2018-11-15 LAB — COMPLETE METABOLIC PANEL WITH GFR
AG Ratio: 1.6 (calc) (ref 1.0–2.5)
ALT: 10 U/L (ref 6–29)
AST: 11 U/L (ref 10–35)
Albumin: 4.2 g/dL (ref 3.6–5.1)
Alkaline phosphatase (APISO): 47 U/L (ref 31–125)
BUN: 10 mg/dL (ref 7–25)
CO2: 23 mmol/L (ref 20–32)
Calcium: 9.4 mg/dL (ref 8.6–10.2)
Chloride: 106 mmol/L (ref 98–110)
Creat: 0.81 mg/dL (ref 0.50–1.10)
GFR, Est African American: 101 mL/min/{1.73_m2} (ref >=60)
GFR, Est Non African American: 87 mL/min/{1.73_m2} (ref >=60)
Globulin: 2.7 g/dL (calc) (ref 1.9–3.7)
Glucose, Bld: 95 mg/dL (ref 65–99)
Potassium: 4.1 mmol/L (ref 3.5–5.3)
Sodium: 139 mmol/L (ref 135–146)
Total Bilirubin: 0.5 mg/dL (ref 0.2–1.2)
Total Protein: 6.9 g/dL (ref 6.1–8.1)

## 2018-11-15 LAB — LIPID PANEL
Cholesterol: 226 mg/dL — ABNORMAL HIGH (ref <200)
HDL: 50 mg/dL (ref >=50)
LDL Cholesterol (Calc): 159 mg/dL (calc) — ABNORMAL HIGH
Non-HDL Cholesterol (Calc): 176 mg/dL (calc) — ABNORMAL HIGH (ref <130)
Total CHOL/HDL Ratio: 4.5 (calc) (ref <5.0)
Triglycerides: 72 mg/dL (ref <150)

## 2018-11-15 LAB — VITAMIN D 25 HYDROXY (VIT D DEFICIENCY, FRACTURES): Vit D, 25-Hydroxy: 86 ng/mL (ref 30–100)

## 2019-01-06 ENCOUNTER — Other Ambulatory Visit: Payer: Self-pay

## 2019-01-06 ENCOUNTER — Ambulatory Visit: Payer: BC Managed Care – PPO | Attending: Internal Medicine

## 2019-01-06 DIAGNOSIS — Z20822 Contact with and (suspected) exposure to covid-19: Secondary | ICD-10-CM

## 2019-01-07 LAB — NOVEL CORONAVIRUS, NAA: SARS-CoV-2, NAA: NOT DETECTED

## 2019-01-14 ENCOUNTER — Ambulatory Visit (INDEPENDENT_AMBULATORY_CARE_PROVIDER_SITE_OTHER): Payer: BC Managed Care – PPO | Admitting: Internal Medicine

## 2019-01-14 ENCOUNTER — Encounter (INDEPENDENT_AMBULATORY_CARE_PROVIDER_SITE_OTHER): Payer: Self-pay | Admitting: Internal Medicine

## 2019-01-14 ENCOUNTER — Other Ambulatory Visit (INDEPENDENT_AMBULATORY_CARE_PROVIDER_SITE_OTHER): Payer: Self-pay | Admitting: Internal Medicine

## 2019-01-14 ENCOUNTER — Other Ambulatory Visit: Payer: Self-pay

## 2019-01-14 VITALS — BP 120/80 | HR 92 | Ht 63.25 in | Wt 168.6 lb

## 2019-01-14 DIAGNOSIS — Z0001 Encounter for general adult medical examination with abnormal findings: Secondary | ICD-10-CM

## 2019-01-14 DIAGNOSIS — E782 Mixed hyperlipidemia: Secondary | ICD-10-CM

## 2019-01-14 DIAGNOSIS — E663 Overweight: Secondary | ICD-10-CM | POA: Diagnosis not present

## 2019-01-14 DIAGNOSIS — Z131 Encounter for screening for diabetes mellitus: Secondary | ICD-10-CM

## 2019-01-14 DIAGNOSIS — E559 Vitamin D deficiency, unspecified: Secondary | ICD-10-CM

## 2019-01-14 DIAGNOSIS — R6889 Other general symptoms and signs: Secondary | ICD-10-CM

## 2019-01-14 NOTE — Progress Notes (Signed)
Chief Complaint: This 49 year old lady comes in for an annual physical exam and to address her symptoms and conditions which are described below. HPI: She has had issues with her overweight state, cold intolerance, constipation and hyperlipidemia.  She has been using desiccated thyroid and this seems to help some of her symptoms. She also has vitamin D deficiency and takes vitamin D3 supplementation for this.  Past Medical History:  Diagnosis Date  . Cold intolerance 10/02/2018  . Constipation 10/02/2018  . HLD (hyperlipidemia) 10/02/2018  . Overweight (BMI 25.0-29.9) 10/02/2018  . Vaginal Pap smear, abnormal   . Vitamin D deficiency disease 10/02/2018   Past Surgical History:  Procedure Laterality Date  . CHOLECYSTECTOMY    . LEEP    . 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 Married for x 1 year. No children.    Social History   Tobacco Use  . Smoking status: Never Smoker  . Smokeless tobacco: Never Used  Substance Use Topics  . Alcohol use: No      Allergies: No Known Allergies   Current Meds  Medication Sig  . ARMOUR THYROID 30 MG tablet TAKE 1 TABLET BY MOUTH EVERY DAY  . Cholecalciferol (VITAMIN D-3) 125 MCG (5000 UT) TABS Take 2 tablets by mouth daily.      Bio-identical hormones This patient is being treated with desiccated thyroid, off label, for symptoms of thyroid deficiency.  The patient has been counseled regarding side effects and how to deal with them.  WGY:[id number] from the symptoms mentioned above,there are no other symptoms referable to all systems reviewed.  Physical Exam: Blood pressure 120/80, pulse 92, height 5' 3.25" (1.607 m), weight 168 lb 9.6 oz (76.5 kg), SpO2 99 %. Vitals with BMI 01/14/2019 11/14/2018 10/02/2018  Height 5' 3.25" _0  _1   Weight 168 lbs 10 oz 163 lbs 6 oz  166 lbs 10 oz  BMI 29.61 57.01 77.9  Systolic 390 300 923  Diastolic 80 80 90  Pulse 92 72 72      She looks systemically well, remains overweight.  She has gained about 5 pounds since the last visit. General: Alert, cooperative, and appears to be the stated age.No pallor.  No jaundice.  No clubbing. Head: Normocephalic Eyes: Sclera white, pupils equal and reactive to light, red reflex x 2,  Ears: Normal bilaterally Oral cavity: Lips, mucosa, and tongue normal: Teeth and gums normal Neck: No adenopathy, supple, symmetrical, trachea midline, and thyroid does not appear enlarged. Breast: No masses found. Respiratory: Clear to auscultation bilaterally.No wheezing, crackles or bronchial breathing. Cardiovascular: Heart sounds are present and appear to be normal without murmurs or added sounds.  No carotid bruits.  Peripheral pulses are present and equal bilaterally.: Gastrointestinal:positive bowel sounds, no hepatosplenomegaly.  No masses felt.No tenderness. Skin: Clear, No rashes noted.No worrisome skin lesions seen. Neurological: Grossly intact without focal findings, cranial nerves II through XII intact, muscle strength equal bilaterally Musculoskeletal: No acute joint abnormalities noted.Full range of movement noted with joints. Psychiatric: Affect appropriate, non-anxious.    Assessment  1. Vitamin D deficiency disease   2. Overweight (BMI 25.0-29.9)   3. Mixed hyperlipidemia   4. Cold intolerance   5. Encounter for general adult medical examination with abnormal findings   6. Screening for diabetes mellitus     Tests  Ordered:   Orders Placed This Encounter  Procedures  . CBC  . CMP with eGFR([facility])  . Hemoglobin A1c  . Lipid Panel  . T3, Free  . TSH  . Vitamin D, 25-hydroxy     Plan  1. Blood work is ordered above. 2. She will continue with Armour Thyroid as before and we will see what the numbers are and see if we need to adjust the dose. 3. Further  recommendations will depend on blood results. 4. I will see her in about 4 months time for follow-up. 5. Today, in addition to a preventative visit, I performed an office visit to address her symptoms and conditions above.     No orders of the defined types were placed in this encounter.    Mitchell Iwanicki C Tahjanae Blankenburg   01/14/2019, 11:53 AM

## 2019-01-15 LAB — LIPID PANEL
Cholesterol: 245 mg/dL — ABNORMAL HIGH (ref <200)
HDL: 59 mg/dL (ref >=50)
LDL Cholesterol (Calc): 168 mg/dL (calc) — ABNORMAL HIGH
Non-HDL Cholesterol (Calc): 186 mg/dL (calc) — ABNORMAL HIGH (ref <130)
Total CHOL/HDL Ratio: 4.2 (calc) (ref <5.0)
Triglycerides: 76 mg/dL (ref <150)

## 2019-01-15 LAB — COMPLETE METABOLIC PANEL WITH GFR
AG Ratio: 1.6 (calc) (ref 1.0–2.5)
ALT: 11 U/L (ref 6–29)
AST: 10 U/L (ref 10–35)
Albumin: 4.4 g/dL (ref 3.6–5.1)
Alkaline phosphatase (APISO): 61 U/L (ref 31–125)
BUN: 9 mg/dL (ref 7–25)
CO2: 25 mmol/L (ref 20–32)
Calcium: 9.5 mg/dL (ref 8.6–10.2)
Chloride: 105 mmol/L (ref 98–110)
Creat: 0.67 mg/dL (ref 0.50–1.10)
GFR, Est African American: 121 mL/min/{1.73_m2} (ref >=60)
GFR, Est Non African American: 105 mL/min/{1.73_m2} (ref >=60)
Globulin: 2.7 g/dL (calc) (ref 1.9–3.7)
Glucose, Bld: 86 mg/dL (ref 65–99)
Potassium: 4 mmol/L (ref 3.5–5.3)
Sodium: 139 mmol/L (ref 135–146)
Total Bilirubin: 0.5 mg/dL (ref 0.2–1.2)
Total Protein: 7.1 g/dL (ref 6.1–8.1)

## 2019-01-15 LAB — CBC
HCT: 41.9 % (ref 35.0–45.0)
Hemoglobin: 13.8 g/dL (ref 11.7–15.5)
MCH: 29.6 pg (ref 27.0–33.0)
MCHC: 32.9 g/dL (ref 32.0–36.0)
MCV: 89.7 fL (ref 80.0–100.0)
MPV: 10.2 fL (ref 7.5–12.5)
Platelets: 217 10*3/uL (ref 140–400)
RBC: 4.67 10*6/uL (ref 3.80–5.10)
RDW: 12.1 % (ref 11.0–15.0)
WBC: 5.7 10*3/uL (ref 3.8–10.8)

## 2019-01-15 LAB — T3, FREE: T3, Free: 3.2 pg/mL (ref 2.3–4.2)

## 2019-01-15 LAB — HEMOGLOBIN A1C
Hgb A1c MFr Bld: 5.5 % of total Hgb (ref <5.7)
Mean Plasma Glucose: 111 (calc)
eAG (mmol/L): 6.2 (calc)

## 2019-01-15 LAB — TSH: TSH: 2.33 mIU/L

## 2019-01-15 LAB — VITAMIN D 25 HYDROXY (VIT D DEFICIENCY, FRACTURES): Vit D, 25-Hydroxy: 80 ng/mL (ref 30–100)

## 2019-01-16 ENCOUNTER — Other Ambulatory Visit (INDEPENDENT_AMBULATORY_CARE_PROVIDER_SITE_OTHER): Payer: Self-pay | Admitting: Internal Medicine

## 2019-01-16 MED ORDER — THYROID 60 MG PO TABS: 60.0000 mg | ORAL_TABLET | Freq: Every day | ORAL | 3 refills | Status: DC

## 2019-01-21 ENCOUNTER — Ambulatory Visit: Payer: BC Managed Care – PPO | Attending: Internal Medicine

## 2019-01-21 ENCOUNTER — Other Ambulatory Visit: Payer: Self-pay

## 2019-01-21 DIAGNOSIS — Z20822 Contact with and (suspected) exposure to covid-19: Secondary | ICD-10-CM

## 2019-01-22 LAB — NOVEL CORONAVIRUS, NAA: SARS-CoV-2, NAA: NOT DETECTED

## 2019-01-31 ENCOUNTER — Other Ambulatory Visit (HOSPITAL_COMMUNITY): Payer: Self-pay | Admitting: Internal Medicine

## 2019-01-31 DIAGNOSIS — Z1231 Encounter for screening mammogram for malignant neoplasm of breast: Secondary | ICD-10-CM

## 2019-02-12 ENCOUNTER — Ambulatory Visit (HOSPITAL_COMMUNITY)
Admission: RE | Admit: 2019-02-12 | Discharge: 2019-02-12 | Disposition: A | Discharge status: Home / Self Care | Payer: BC Managed Care – PPO | Source: Ambulatory Visit | Attending: Internal Medicine | Admitting: Internal Medicine

## 2019-02-12 ENCOUNTER — Other Ambulatory Visit: Payer: Self-pay

## 2019-02-12 DIAGNOSIS — Z1231 Encounter for screening mammogram for malignant neoplasm of breast: Secondary | ICD-10-CM | POA: Insufficient documentation

## 2019-02-14 ENCOUNTER — Other Ambulatory Visit (HOSPITAL_COMMUNITY): Payer: Self-pay | Admitting: Internal Medicine

## 2019-02-14 DIAGNOSIS — R928 Other abnormal and inconclusive findings on diagnostic imaging of breast: Secondary | ICD-10-CM

## 2019-02-18 ENCOUNTER — Other Ambulatory Visit (INDEPENDENT_AMBULATORY_CARE_PROVIDER_SITE_OTHER): Payer: Self-pay | Admitting: Internal Medicine

## 2019-02-25 ENCOUNTER — Other Ambulatory Visit: Payer: Self-pay

## 2019-02-25 ENCOUNTER — Ambulatory Visit (HOSPITAL_COMMUNITY)
Admission: RE | Admit: 2019-02-25 | Discharge: 2019-02-25 | Disposition: A | Discharge status: Home / Self Care | Payer: BC Managed Care – PPO | Source: Ambulatory Visit | Attending: Internal Medicine | Admitting: Internal Medicine

## 2019-02-25 DIAGNOSIS — R928 Other abnormal and inconclusive findings on diagnostic imaging of breast: Secondary | ICD-10-CM

## 2019-03-09 ENCOUNTER — Ambulatory Visit: Payer: BC Managed Care – PPO | Attending: Internal Medicine

## 2019-03-09 DIAGNOSIS — Z23 Encounter for immunization: Secondary | ICD-10-CM

## 2019-03-09 NOTE — Progress Notes (Signed)
   Covid-19 Vaccination Clinic  Name:  JAKARI JACOT    MRN: [id number] DOB: Dec 20, 1970  03/09/2019  Ms. Sarratt was observed post Covid-19 immunization for 15 minutes without incident. She was provided with Vaccine Information Sheet and instruction to access the V-Safe system.   Ms. Heckel was instructed to call 911 with any severe reactions post vaccine: Marland Kitchen Difficulty breathing  . Swelling of face and throat  . A fast heartbeat  . A bad rash all over body  . Dizziness and weakness   Immunizations Administered    Name Date Dose VIS Date Route   Pfizer COVID-19 Vaccine 03/09/2019  9:30 AM 0.3 mL 12/13/2018 Intramuscular   Manufacturer: [place]   Lot: [id number]   [place]: [address]-8

## 2019-03-30 ENCOUNTER — Ambulatory Visit: Payer: BC Managed Care – PPO | Attending: Internal Medicine

## 2019-03-30 DIAGNOSIS — Z23 Encounter for immunization: Secondary | ICD-10-CM

## 2019-03-30 NOTE — Progress Notes (Signed)
   Covid-19 Vaccination Clinic  Name:  Kimberly Brennan    MRN: [id number] DOB: February 10, 1970  03/30/2019  Kimberly Brennan was observed post Covid-19 immunization for 15 minutes without incident. She was provided with Vaccine Information Sheet and instruction to access the V-Safe system.   Kimberly Brennan was instructed to call 911 with any severe reactions post vaccine: Marland Kitchen Difficulty breathing  . Swelling of face and throat  . A fast heartbeat  . A bad rash all over body  . Dizziness and weakness   Immunizations Administered    Name Date Dose VIS Date Route   Pfizer COVID-19 Vaccine 03/30/2019 10:28 AM 0.3 mL 12/13/2018 Intramuscular   Manufacturer: [place]   Lot: [id number]   [place]: [address]-1

## 2019-05-13 ENCOUNTER — Other Ambulatory Visit (INDEPENDENT_AMBULATORY_CARE_PROVIDER_SITE_OTHER): Payer: Self-pay | Admitting: Internal Medicine

## 2019-05-15 ENCOUNTER — Other Ambulatory Visit: Payer: Self-pay

## 2019-05-15 ENCOUNTER — Encounter (INDEPENDENT_AMBULATORY_CARE_PROVIDER_SITE_OTHER): Payer: Self-pay | Admitting: Internal Medicine

## 2019-05-15 ENCOUNTER — Ambulatory Visit (INDEPENDENT_AMBULATORY_CARE_PROVIDER_SITE_OTHER): Payer: BC Managed Care – PPO | Admitting: Internal Medicine

## 2019-05-15 ENCOUNTER — Other Ambulatory Visit (INDEPENDENT_AMBULATORY_CARE_PROVIDER_SITE_OTHER): Payer: Self-pay | Admitting: Internal Medicine

## 2019-05-15 VITALS — BP 118/82 | Temp 97.3°F | Resp 16 | Ht 63.0 in | Wt 169.8 lb

## 2019-05-15 DIAGNOSIS — E559 Vitamin D deficiency, unspecified: Secondary | ICD-10-CM | POA: Diagnosis not present

## 2019-05-15 DIAGNOSIS — E782 Mixed hyperlipidemia: Secondary | ICD-10-CM

## 2019-05-15 DIAGNOSIS — R6889 Other general symptoms and signs: Secondary | ICD-10-CM | POA: Diagnosis not present

## 2019-05-15 NOTE — Progress Notes (Signed)
Metrics: Intervention Frequency ACO  Documented Smoking Status Yearly  Screened one or more times in 24 months  Cessation Counseling or  Active cessation medication Past 24 months  Past 24 months   Guideline developer: UpToDate (See UpToDate for funding source) Date Released: 2014       Wellness Office Visit  Subjective:  Patient ID: Kimberly Brennan, female    DOB: [birth year]  Age: 49 y.o. MRN: [id number]  CC: This lady comes in for follow-up of hyperlipidemia and symptoms of thyroid deficiency in relation to her obesity and overweight state. HPI  She did not tolerate the higher dose of Armour Thyroid from the last visit without any problems.  She still has challenges with nutrition and has been under more stress recently as they buying a new house. Past Medical History:  Diagnosis Date  . Cold intolerance 10/02/2018  . Constipation 10/02/2018  . HLD (hyperlipidemia) 10/02/2018  . Overweight (BMI 25.0-29.9) 10/02/2018  . Vaginal Pap smear, abnormal   . 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 Married for x 1 year. No children.   Social History   Tobacco Use  . Smoking status: Never Smoker  . Smokeless tobacco: Never Used  Substance Use Topics  . Alcohol use: No    Current Meds  Medication Sig  . ARMOUR THYROID 60 MG tablet TAKE 1 TABLET (60 MG TOTAL) BY MOUTH DAILY BEFORE BREAKFAST.  Marland Kitchen Cholecalciferol (VITAMIN D-3) 125 MCG (5000 UT) TABS Take 2 tablets by mouth daily.       Objective:   Today's Vitals: BP 118/82   Temp (!) 97.3 F (36.3 C) (Temporal)   Resp 16   Ht 5' 3"  (1.6 m)   Wt 169 lb 12.8 oz (77 kg)   SpO2 99%   BMI 30.08 kg/m  Vitals with BMI 05/15/2019 01/14/2019 11/14/2018  Height 5' 3"  5' 3.25" 5' 6"   Weight 169 lbs 13 oz 168  lbs 10 oz 163 lbs 6 oz  BMI 30.09 70.17 79.39  Systolic 030 092 330  Diastolic 82 80 80  Pulse - 92 72     Physical Exam  She looks systemically well.  Weight is stable.  Blood pressure is okay.     Assessment   1. Vitamin D deficiency disease   2. Mixed hyperlipidemia   3. Cold intolerance       Tests ordered Orders Placed This Encounter  Procedures  . Lipid panel  . T3, free     Plan: 1. I will check lipid panel today.  She does not need statin therapy. 2. We will check thyroid levels today to see if we need further adjustment upwards. 3. Follow-up in 3 months time.   No orders of the defined types were placed in this encounter.   Doree Albee, MD

## 2019-05-16 LAB — LIPID PANEL
Cholesterol: 232 mg/dL — ABNORMAL HIGH (ref <200)
HDL: 61 mg/dL (ref >=50)
LDL Cholesterol (Calc): 156 mg/dL (calc) — ABNORMAL HIGH
Non-HDL Cholesterol (Calc): 171 mg/dL (calc) — ABNORMAL HIGH (ref <130)
Total CHOL/HDL Ratio: 3.8 (calc) (ref <5.0)
Triglycerides: 57 mg/dL (ref <150)

## 2019-05-16 LAB — T3, FREE: T3, Free: 4 pg/mL (ref 2.3–4.2)

## 2019-05-19 ENCOUNTER — Encounter (INDEPENDENT_AMBULATORY_CARE_PROVIDER_SITE_OTHER): Payer: Self-pay | Admitting: Internal Medicine

## 2019-05-19 ENCOUNTER — Other Ambulatory Visit (INDEPENDENT_AMBULATORY_CARE_PROVIDER_SITE_OTHER): Payer: Self-pay | Admitting: Internal Medicine

## 2019-05-19 MED ORDER — ARMOUR THYROID 90 MG PO TABS: 90.0000 mg | ORAL_TABLET | Freq: Every day | ORAL | 3 refills | Status: DC

## 2019-05-20 ENCOUNTER — Other Ambulatory Visit (INDEPENDENT_AMBULATORY_CARE_PROVIDER_SITE_OTHER): Payer: Self-pay | Admitting: Internal Medicine

## 2019-05-20 ENCOUNTER — Telehealth (INDEPENDENT_AMBULATORY_CARE_PROVIDER_SITE_OTHER): Payer: Self-pay

## 2019-05-20 MED ORDER — ARMOUR THYROID 90 MG PO TABS: 90.0000 mg | ORAL_TABLET | Freq: Every day | ORAL | 3 refills | Status: DC

## 2019-05-20 NOTE — Telephone Encounter (Signed)
Kimberly Brennan is calling stating that the new Rx for NP Thyroid 90 should have went to [facility] [place] instead of [facility], please advise?

## 2019-05-20 NOTE — Telephone Encounter (Signed)
I have sent another prescription to the [facility] in [place] now.

## 2019-05-20 NOTE — Telephone Encounter (Signed)
noted 

## 2019-05-26 ENCOUNTER — Encounter (INDEPENDENT_AMBULATORY_CARE_PROVIDER_SITE_OTHER): Payer: Self-pay | Admitting: Internal Medicine

## 2019-05-27 NOTE — Progress Notes (Signed)
Patient called. Asked to please go into mychart and review her labs.

## 2019-06-07 ENCOUNTER — Other Ambulatory Visit (INDEPENDENT_AMBULATORY_CARE_PROVIDER_SITE_OTHER): Payer: Self-pay | Admitting: Internal Medicine

## 2019-06-09 ENCOUNTER — Other Ambulatory Visit (INDEPENDENT_AMBULATORY_CARE_PROVIDER_SITE_OTHER): Payer: Self-pay

## 2019-08-19 ENCOUNTER — Ambulatory Visit (INDEPENDENT_AMBULATORY_CARE_PROVIDER_SITE_OTHER): Payer: BC Managed Care – PPO | Admitting: Internal Medicine

## 2019-08-19 ENCOUNTER — Encounter (INDEPENDENT_AMBULATORY_CARE_PROVIDER_SITE_OTHER): Payer: Self-pay | Admitting: Internal Medicine

## 2019-08-19 ENCOUNTER — Other Ambulatory Visit: Payer: Self-pay

## 2019-08-19 VITALS — BP 126/80 | HR 82 | Temp 97.7°F | Resp 18 | Ht 63.0 in | Wt 161.0 lb

## 2019-08-19 DIAGNOSIS — E663 Overweight: Secondary | ICD-10-CM | POA: Diagnosis not present

## 2019-08-19 DIAGNOSIS — R6889 Other general symptoms and signs: Secondary | ICD-10-CM | POA: Diagnosis not present

## 2019-08-19 DIAGNOSIS — E559 Vitamin D deficiency, unspecified: Secondary | ICD-10-CM | POA: Diagnosis not present

## 2019-08-19 LAB — T3, FREE: T3, Free: 3.4 pg/mL (ref 2.3–4.2)

## 2019-08-19 NOTE — Progress Notes (Signed)
Metrics: Intervention Frequency ACO  Documented Smoking Status Yearly  Screened one or more times in 24 months  Cessation Counseling or  Active cessation medication Past 24 months  Past 24 months   Guideline developer: UpToDate (See UpToDate for funding source) Date Released: 2014       Wellness Office Visit  Subjective:  Patient ID: Kimberly Brennan, female    DOB: 01-Jan-1971  Age: 49 y.o. MRN: [id number]  CC: This lady comes in for follow-up regarding her cold intolerance, constipation, overweight state. HPI  I increased her Armour Thyroid medication last time and she has tolerated this well and feels better.  Her cold intolerance is improved and she  feels more energized. Past Medical History:  Diagnosis Date  . Cold intolerance 10/02/2018  . Constipation 10/02/2018  . HLD (hyperlipidemia) 10/02/2018  . Overweight (BMI 25.0-29.9) 10/02/2018  . Vaginal Pap smear, abnormal   . Vitamin D deficiency disease 10/02/2018   Past Surgical History:  Procedure Laterality Date  . CHOLECYSTECTOMY    . LEEP    . 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 Married for x 1 year. No children.   Social History   Tobacco Use  . Smoking status: Never Smoker  . Smokeless tobacco: Never Used  Substance Use Topics  . Alcohol use: No    Current Meds  Medication Sig  . ARMOUR THYROID 90 MG tablet Take 1 tablet (90 mg total) by mouth daily.  . Cholecalciferol (VITAMIN D-3) 125 MCG (5000 UT) TABS Take 2 tablets by mouth daily.        Depression screen [facility] 2/9 05/15/2019 01/24/2018  Decreased Interest 0 0  Down, Depressed, Hopeless 0 0  PHQ - 2 Score 0 0     Objective:   Today's Vitals: BP 126/80 (BP Location: Right Arm, Patient Position: Sitting, Cuff Size: Normal)   Pulse 82   Temp  97.7 F (36.5 C) (Temporal)   Resp 18   Ht 5' 3"  (1.6 m)   Wt 161 lb (73 kg)   SpO2 98% Comment: wearing mask  BMI 28.52 kg/m  Vitals with BMI 08/19/2019 05/15/2019 01/14/2019  Height 5' 3"  5' 3"  5' 3.25"  Weight 161 lbs 169 lbs 13 oz 168 lbs 10 oz  BMI 28.53 57.26 20.35  Systolic 597 416 384  Diastolic 80 82 80  Pulse 82 - 92     Physical Exam   She looks systemically well.  She has lost 8 pounds since the last visit.  Blood pressure is in good control.    Assessment   1. Cold intolerance   2. Vitamin D deficiency disease   3. Overweight (BMI 25.0-29.9)       Tests ordered Orders Placed This Encounter  Procedures  . T3, free     Plan: 1. I will check a T3 level and see if we need to further adjust the Armour Thyroid.  I doubt this will be the case. 2. I will see her in about 5 months for annual physical exam   No orders of the defined types were placed in this encounter.   Doree Albee, MD

## 2019-10-02 ENCOUNTER — Other Ambulatory Visit (INDEPENDENT_AMBULATORY_CARE_PROVIDER_SITE_OTHER): Payer: Self-pay | Admitting: Internal Medicine

## 2020-01-19 ENCOUNTER — Encounter (INDEPENDENT_AMBULATORY_CARE_PROVIDER_SITE_OTHER): Payer: BC Managed Care – PPO | Admitting: Internal Medicine

## 2020-01-21 ENCOUNTER — Encounter (INDEPENDENT_AMBULATORY_CARE_PROVIDER_SITE_OTHER): Payer: Self-pay | Admitting: Nurse Practitioner

## 2020-01-21 ENCOUNTER — Other Ambulatory Visit: Payer: Self-pay

## 2020-01-21 ENCOUNTER — Ambulatory Visit (INDEPENDENT_AMBULATORY_CARE_PROVIDER_SITE_OTHER): Payer: BC Managed Care – PPO | Admitting: Nurse Practitioner

## 2020-01-21 VITALS — BP 128/84 | HR 74 | Temp 97.9°F | Ht 63.0 in | Wt 164.4 lb

## 2020-01-21 DIAGNOSIS — Z131 Encounter for screening for diabetes mellitus: Secondary | ICD-10-CM

## 2020-01-21 DIAGNOSIS — Z0001 Encounter for general adult medical examination with abnormal findings: Secondary | ICD-10-CM

## 2020-01-21 DIAGNOSIS — Z1159 Encounter for screening for other viral diseases: Secondary | ICD-10-CM | POA: Diagnosis not present

## 2020-01-21 DIAGNOSIS — E559 Vitamin D deficiency, unspecified: Secondary | ICD-10-CM | POA: Diagnosis not present

## 2020-01-21 DIAGNOSIS — E782 Mixed hyperlipidemia: Secondary | ICD-10-CM

## 2020-01-21 DIAGNOSIS — E663 Overweight: Secondary | ICD-10-CM

## 2020-01-21 DIAGNOSIS — R6889 Other general symptoms and signs: Secondary | ICD-10-CM

## 2020-01-21 MED ORDER — THYROID 90 MG PO TABS: 90.0000 mg | ORAL_TABLET | Freq: Every day | ORAL | 3 refills | Status: DC

## 2020-01-21 NOTE — Addendum Note (Signed)
Addended by: Jeralyn Ruths E on: 01/21/2020 11:37 AM   Modules accepted: Orders

## 2020-01-21 NOTE — Progress Notes (Signed)
Subjective:  Patient ID: Kimberly Brennan, female    DOB: 10-19-1970  Age: 50 y.o. MRN: [id number]  CC:  Chief Complaint  Patient presents with  . Annual Exam    Patient states that she is doing well      HPI  This patient arrives today for the above.  Patient is up-to-date with COVID-19 vaccines as well as tetanus shot.  She did not get a flu shot this year for tell me she never gets a flu shot and prefers not to get 1 administered today.  She is due for COVID-19 booster and is considering having this administered.  She did have her husband who had COVID recently and although she has not had any symptoms and has tested negative she is considering holding off on getting the booster at this time.  As for health maintenance, she is due for mammogram and tells me she is planning on getting this scheduled soon.  She would be due for colon cancer screening but would like to hold off until she is 66.  She is also due for hepatitis C screening.  She is up-to-date with her Pap smear.  She is also due for depression screening today.  Past Medical History:  Diagnosis Date  . Cold intolerance 10/02/2018  . Constipation 10/02/2018  . HLD (hyperlipidemia) 10/02/2018  . Overweight (BMI 25.0-29.9) 10/02/2018  . Vaginal Pap smear, abnormal   . 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 Married for x 1 year. No children.   Social History   Tobacco Use  . Smoking status: Never Smoker  . Smokeless tobacco: Never Used  Substance Use Topics  . Alcohol use: No     Current Meds  Medication Sig  . ARMOUR THYROID 90 MG tablet TAKE 1 TABLET (90 MG TOTAL) BY MOUTH DAILY.  Marland Kitchen Cholecalciferol (VITAMIN D-3) 125 MCG (5000 UT) TABS Take 2 tablets by mouth daily.   . Probiotic,  Lactobacillus, CAPS Take by mouth.    ROS:  Review of Systems  Respiratory: Negative for shortness of breath.   Cardiovascular: Negative for chest pain.  Gastrointestinal: Negative for abdominal pain and blood in stool.  Neurological: Negative for headaches.     Objective:   Today's Vitals: BP 128/84   Pulse 74   Temp 97.9 F (36.6 C) (Temporal)   Ht 5' 3"  (1.6 m)   Wt 164 lb 6.4 oz (74.6 kg)   SpO2 99%   BMI 29.12 kg/m  Vitals with BMI 01/21/2020 08/19/2019 05/15/2019  Height 5' 3"  5' 3"  5' 3"   Weight 164 lbs 6 oz 161 lbs 169 lbs 13 oz  BMI 29.13 46.65 99.35  Systolic 701 779 390  Diastolic 84 80 82  Pulse 74 82 -     Physical Exam Vitals reviewed. Exam conducted with a chaperone present.  Constitutional:      Appearance: Normal appearance.  HENT:     Head: Normocephalic and atraumatic.     Right Ear: Tympanic membrane, ear canal and external ear normal. There is impacted cerumen.     Left Ear: Tympanic membrane, ear canal and external ear normal.  Eyes:     General:        Right eye: No discharge.  Left eye: No discharge.     Extraocular Movements: Extraocular movements intact.     Conjunctiva/sclera: Conjunctivae normal.     Pupils: Pupils are equal, round, and reactive to light.  Neck:     Vascular: No carotid bruit.  Cardiovascular:     Rate and Rhythm: Normal rate and regular rhythm.     Pulses: Normal pulses.     Heart sounds: Normal heart sounds. No murmur heard.   Pulmonary:     Effort: Pulmonary effort is normal.     Breath sounds: Normal breath sounds.  Chest:  Breasts: Breasts are symmetrical.     Right: Normal. No supraclavicular adenopathy.     Left: Normal. No supraclavicular adenopathy.      Comments: Sheffield Slider as chaperone Abdominal:     General: Abdomen is flat. Bowel sounds are normal. There is no distension.     Palpations: Abdomen is soft. There is no mass.     Tenderness: There is no abdominal tenderness.  Musculoskeletal:         General: No tenderness.     Cervical back: Neck supple. No muscular tenderness.     Right lower leg: No edema.     Left lower leg: No edema.  Lymphadenopathy:     Cervical: No cervical adenopathy.     Upper Body:     Right upper body: No supraclavicular adenopathy.     Left upper body: No supraclavicular adenopathy.  Skin:    General: Skin is warm and dry.  Neurological:     General: No focal deficit present.     Mental Status: She is alert and oriented to person, place, and time.     Motor: No weakness.     Gait: Gait normal.  Psychiatric:        Mood and Affect: Mood normal.        Behavior: Behavior normal.        Judgment: Judgment normal.        PHQ9 SCORE ONLY 01/21/2020 05/15/2019 01/24/2018  PHQ-9 Total Score 0 0 0     Assessment and Plan   1. Encounter for general adult medical examination with abnormal findings   2. Encounter for hepatitis C screening test for low risk patient   3. Vitamin D deficiency disease   4. Overweight (BMI 25.0-29.9)   5. Mixed hyperlipidemia   6. Screening for diabetes mellitus      Plan: 1.-6.  Unfortunately we do not have the flu vaccines in the office today as they were moved to an area that had a backup generator due to inclement weather and possible power outages.  I did discuss that if she decides she would like to have a flu vaccine she should proceed to her pharmacy or come back to our office next week at which point we should have the flu vaccines back in the office.  She tells me she understands.  We did discuss that colon cancer screening guidelines have changed and the recommendation is that the adults begin colon cancer screening at age 41, she tells me she would prefer to hold off for now but would let me know if changes her mind.  Thus will not refer to gastroenterology for colon cancer screening at this time.  Up-to-date on Pap smear.  She was encouraged to call to have mammogram scheduled and she plans on doing so.   We also discussed that I think because she has tested negative for COVID-19 and she is not  having symptoms that it would be acceptable to have a booster vaccine administered anytime.  However if she is very concerned that she should could wait 90 days.  She tells me she will consider this.  Blood work will be collected today for further evaluation, and recommendations may be made based upon these results.   Tests ordered Orders Placed This Encounter  Procedures  . Hep C Antibody  . CBC with Differential/Platelets  . CMP with eGFR([facility])  . Lipid Panel  . Hemoglobin A1c  . TSH  . T3, Free  . T4, Free  . Vitamin D, 25-hydroxy      No orders of the defined types were placed in this encounter.   Patient to follow-up in 3 months or sooner as needed.  Ailene Ards, NP

## 2020-01-22 LAB — CBC WITH DIFFERENTIAL/PLATELET
Absolute Monocytes: 338 cells/uL (ref 200–950)
Basophils Absolute: 52 cells/uL (ref 0–200)
Basophils Relative: 1 %
Eosinophils Absolute: 31 cells/uL (ref 15–500)
Eosinophils Relative: 0.6 %
HCT: 40 % (ref 35.0–45.0)
Hemoglobin: 13.4 g/dL (ref 11.7–15.5)
Lymphs Abs: 1841 cells/uL (ref 850–3900)
MCH: 30.2 pg (ref 27.0–33.0)
MCHC: 33.5 g/dL (ref 32.0–36.0)
MCV: 90.3 fL (ref 80.0–100.0)
MPV: 11.3 fL (ref 7.5–12.5)
Monocytes Relative: 6.5 %
Neutro Abs: 2938 cells/uL (ref 1500–7800)
Neutrophils Relative %: 56.5 %
Platelets: 175 10*3/uL (ref 140–400)
RBC: 4.43 10*6/uL (ref 3.80–5.10)
RDW: 11.9 % (ref 11.0–15.0)
Total Lymphocyte: 35.4 %
WBC: 5.2 10*3/uL (ref 3.8–10.8)

## 2020-01-22 LAB — COMPLETE METABOLIC PANEL WITH GFR
AG Ratio: 1.7 (calc) (ref 1.0–2.5)
ALT: 9 U/L (ref 6–29)
AST: 11 U/L (ref 10–35)
Albumin: 4 g/dL (ref 3.6–5.1)
Alkaline phosphatase (APISO): 51 U/L (ref 31–125)
BUN: 11 mg/dL (ref 7–25)
CO2: 27 mmol/L (ref 20–32)
Calcium: 9.1 mg/dL (ref 8.6–10.2)
Chloride: 104 mmol/L (ref 98–110)
Creat: 0.66 mg/dL (ref 0.50–1.10)
GFR, Est African American: 121 mL/min/{1.73_m2} (ref >=60)
GFR, Est Non African American: 104 mL/min/{1.73_m2} (ref >=60)
Globulin: 2.4 g/dL (calc) (ref 1.9–3.7)
Glucose, Bld: 88 mg/dL (ref 65–139)
Potassium: 3.9 mmol/L (ref 3.5–5.3)
Sodium: 138 mmol/L (ref 135–146)
Total Bilirubin: 0.3 mg/dL (ref 0.2–1.2)
Total Protein: 6.4 g/dL (ref 6.1–8.1)

## 2020-01-22 LAB — LIPID PANEL
Cholesterol: 217 mg/dL — ABNORMAL HIGH (ref <200)
HDL: 52 mg/dL (ref >=50)
LDL Cholesterol (Calc): 148 mg/dL (calc) — ABNORMAL HIGH
Non-HDL Cholesterol (Calc): 165 mg/dL (calc) — ABNORMAL HIGH (ref <130)
Total CHOL/HDL Ratio: 4.2 (calc) (ref <5.0)
Triglycerides: 71 mg/dL (ref <150)

## 2020-01-22 LAB — HEMOGLOBIN A1C
Hgb A1c MFr Bld: 5.4 % of total Hgb (ref <5.7)
Mean Plasma Glucose: 108 mg/dL
eAG (mmol/L): 6 mmol/L

## 2020-01-22 LAB — VITAMIN D 25 HYDROXY (VIT D DEFICIENCY, FRACTURES): Vit D, 25-Hydroxy: 116 ng/mL — ABNORMAL HIGH (ref 30–100)

## 2020-01-22 LAB — TSH: TSH: 0.89 mIU/L

## 2020-01-22 LAB — HEPATITIS C ANTIBODY
Hepatitis C Ab: NONREACTIVE
SIGNAL TO CUT-OFF: 0 (ref <1.00)

## 2020-01-22 LAB — T4, FREE: Free T4: 0.9 ng/dL (ref 0.8–1.8)

## 2020-01-22 LAB — T3, FREE: T3, Free: 4.7 pg/mL — ABNORMAL HIGH (ref 2.3–4.2)

## 2020-04-02 ENCOUNTER — Other Ambulatory Visit (HOSPITAL_COMMUNITY): Payer: Self-pay | Admitting: Internal Medicine

## 2020-04-02 DIAGNOSIS — Z1231 Encounter for screening mammogram for malignant neoplasm of breast: Secondary | ICD-10-CM

## 2020-04-14 ENCOUNTER — Encounter (INDEPENDENT_AMBULATORY_CARE_PROVIDER_SITE_OTHER): Payer: Self-pay | Admitting: Nurse Practitioner

## 2020-04-14 ENCOUNTER — Ambulatory Visit (INDEPENDENT_AMBULATORY_CARE_PROVIDER_SITE_OTHER): Payer: BC Managed Care – PPO | Admitting: Nurse Practitioner

## 2020-04-14 ENCOUNTER — Other Ambulatory Visit: Payer: Self-pay

## 2020-04-14 VITALS — BP 118/76 | HR 72 | Temp 97.7°F | Ht 63.0 in | Wt 165.0 lb

## 2020-04-14 DIAGNOSIS — E782 Mixed hyperlipidemia: Secondary | ICD-10-CM

## 2020-04-14 DIAGNOSIS — Z0000 Encounter for general adult medical examination without abnormal findings: Secondary | ICD-10-CM | POA: Diagnosis not present

## 2020-04-14 DIAGNOSIS — E559 Vitamin D deficiency, unspecified: Secondary | ICD-10-CM

## 2020-04-14 NOTE — Progress Notes (Signed)
   Subjective:  Patient ID: Kimberly Brennan, female    DOB: 09/26/1970  Age: 50 y.o. MRN: [id number]  CC:  Chief Complaint  Patient presents with  . Follow-up  . Other    Health maintenance, hyperlipidemia, vitamin D deficiency      HPI  This patient arrives today for the above.  Health maintenance: She is due for colon cancer screening.  She is scheduled for a mammogram for breast cancer screening in about 2 months.  Hyperlipidemia: Last office visit approximate 3 months ago cholesterol panel was collected and LDL was 148.  ASCVD risk score is approximately 1.4.  Vitamin D deficiency: Last time blood was checked approximately 3 months ago her vitamin D level was quite elevated.  At that point recommend she reduce her intake from 10,000 to 5000 IUs by mouth daily.  Tells me she has made this reduction in dosage.  Past Medical History:  Diagnosis Date  . Cold intolerance 10/02/2018  . Constipation 10/02/2018  . HLD (hyperlipidemia) 10/02/2018  . Overweight (BMI 25.0-29.9) 10/02/2018  . Vaginal Pap smear, abnormal   . 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 Previously divorced. Married for x 1 year. No children.   Social History   Tobacco Use  . Smoking status: Never Smoker  . Smokeless tobacco: Never Used  Substance Use Topics  . Alcohol use: No     Current Meds  Medication Sig  . Cholecalciferol (VITAMIN D-3) 125 MCG (5000 UT) TABS Take 2 tablets by mouth daily.   . Probiotic, Lactobacillus, CAPS Take by mouth.  . thyroid (ARMOUR THYROID) 90 MG tablet Take 1 tablet (90 mg total) by mouth daily.    ROS:  Review of Systems  Constitutional: Negative for malaise/fatigue.  Respiratory: Negative for shortness of breath.   Cardiovascular: Negative for chest pain.      Objective:   Today's Vitals: BP 118/76   Pulse 72   Temp 97.7 F (36.5 C) (Temporal)   Ht 5' 3" (1.6 m)   Wt 165 lb (74.8 kg)   SpO2 98%   BMI 29.23 kg/m  Vitals with BMI 04/14/2020 01/21/2020 08/19/2019  Height 5' 3" 5' 3" 5' 3"  Weight 165 lbs 164 lbs 6 oz 161 lbs  BMI 29.24 29.13 28.53  Systolic 118 128 126  Diastolic 76 84 80  Pulse 72 74 82     Physical Exam Vitals reviewed.  Constitutional:      General: She is not in acute distress.    Appearance: Normal appearance.  HENT:     Head: Normocephalic and atraumatic.  Neck:     Vascular: No carotid bruit.  Cardiovascular:     Rate and Rhythm: Normal rate and regular rhythm.     Pulses: Normal pulses.     Heart sounds: Normal heart sounds.  Pulmonary:     Effort: Pulmonary effort is normal.     Breath sounds: Normal breath sounds.  Skin:    General: Skin is warm and dry.  Neurological:     General: No focal deficit present.     Mental Status: She is alert and oriented to person, place, and time.  Psychiatric:        Mood and Affect: Mood normal.          Behavior: Behavior normal.        Judgment: Judgment normal.          Assessment and Plan   1. Vitamin D deficiency disease   2. Mixed hyperlipidemia   3. Healthcare maintenance      Plan: 1.  We will check serum vitamin D and CMP today for further evaluation. 2.  We did discuss lifestyle and I recommended she focus on living as healthy lifestyle as possible, but would not recommend she start medication at this time. 3.  We did discuss colon cancer screening and the different options especially colonoscopy versus Cologuard.  She denies any family/personal history of colon cancer personal, personal history of ulcerative colitis or inflammatory bowel disease.  At this point she tells me she would prefer not to do any colon cancer screenings, but would consider this in the near future.  Would recommend that this is discussed at next office  visit.   Tests ordered Orders Placed This Encounter  Procedures  . CMP with eGFR([facility])  . Vitamin D, 25-hydroxy      No orders of the defined types were placed in this encounter.   Patient to follow-up in 6 months or sooner as needed.  Ailene Ards, NP

## 2020-04-15 LAB — COMPLETE METABOLIC PANEL WITH GFR
AG Ratio: 1.7 (calc) (ref 1.0–2.5)
ALT: 11 U/L (ref 6–29)
AST: 13 U/L (ref 10–35)
Albumin: 4.1 g/dL (ref 3.6–5.1)
Alkaline phosphatase (APISO): 47 U/L (ref 31–125)
BUN: 14 mg/dL (ref 7–25)
CO2: 25 mmol/L (ref 20–32)
Calcium: 8.9 mg/dL (ref 8.6–10.2)
Chloride: 105 mmol/L (ref 98–110)
Creat: 0.65 mg/dL (ref 0.50–1.10)
GFR, Est African American: 122 mL/min/{1.73_m2} (ref >=60)
GFR, Est Non African American: 105 mL/min/{1.73_m2} (ref >=60)
Globulin: 2.4 g/dL (calc) (ref 1.9–3.7)
Glucose, Bld: 84 mg/dL (ref 65–139)
Potassium: 3.6 mmol/L (ref 3.5–5.3)
Sodium: 139 mmol/L (ref 135–146)
Total Bilirubin: 0.4 mg/dL (ref 0.2–1.2)
Total Protein: 6.5 g/dL (ref 6.1–8.1)

## 2020-04-15 LAB — VITAMIN D 25 HYDROXY (VIT D DEFICIENCY, FRACTURES): Vit D, 25-Hydroxy: 97 ng/mL (ref 30–100)

## 2020-06-02 ENCOUNTER — Other Ambulatory Visit (INDEPENDENT_AMBULATORY_CARE_PROVIDER_SITE_OTHER): Payer: Self-pay | Admitting: Nurse Practitioner

## 2020-06-02 DIAGNOSIS — R6889 Other general symptoms and signs: Secondary | ICD-10-CM

## 2020-06-09 ENCOUNTER — Ambulatory Visit (HOSPITAL_COMMUNITY)
Admission: RE | Admit: 2020-06-09 | Discharge: 2020-06-09 | Disposition: A | Discharge status: Home / Self Care | Payer: BC Managed Care – PPO | Source: Ambulatory Visit | Attending: Internal Medicine | Admitting: Internal Medicine

## 2020-06-09 DIAGNOSIS — Z1231 Encounter for screening mammogram for malignant neoplasm of breast: Secondary | ICD-10-CM | POA: Insufficient documentation

## 2020-06-11 ENCOUNTER — Other Ambulatory Visit (HOSPITAL_COMMUNITY): Payer: Self-pay | Admitting: Internal Medicine

## 2020-06-14 ENCOUNTER — Other Ambulatory Visit (HOSPITAL_COMMUNITY): Payer: Self-pay | Admitting: Internal Medicine

## 2020-06-14 DIAGNOSIS — R928 Other abnormal and inconclusive findings on diagnostic imaging of breast: Secondary | ICD-10-CM

## 2020-06-22 IMAGING — MG DIGITAL SCREENING BILAT W/ TOMO W/ CAD
8 series · 8 of 24 positions shown · non-contrast
Comparison: Previous exam(s).

CLINICAL DATA: Screening.

EXAM:
DIGITAL SCREENING BILATERAL MAMMOGRAM WITH TOMO AND CAD

[L CC synth-2D]
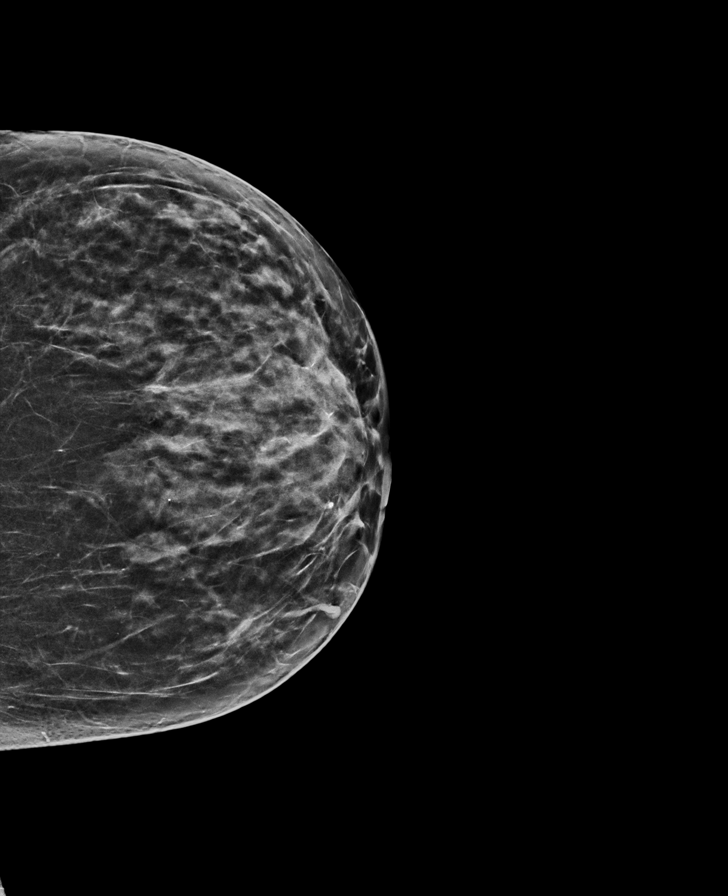

[R CC synth-2D]
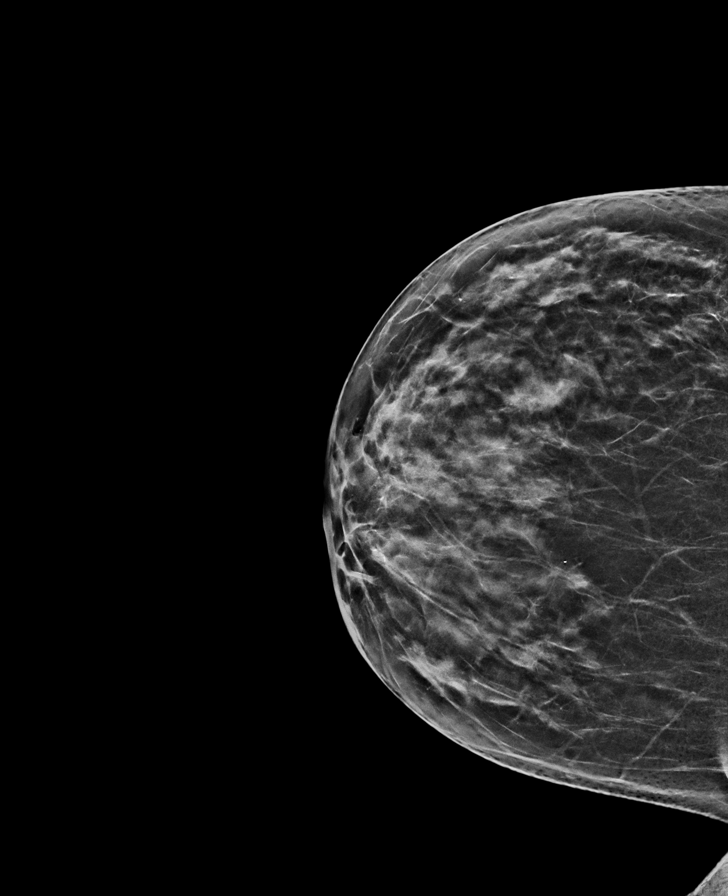

[L MLO synth-2D]
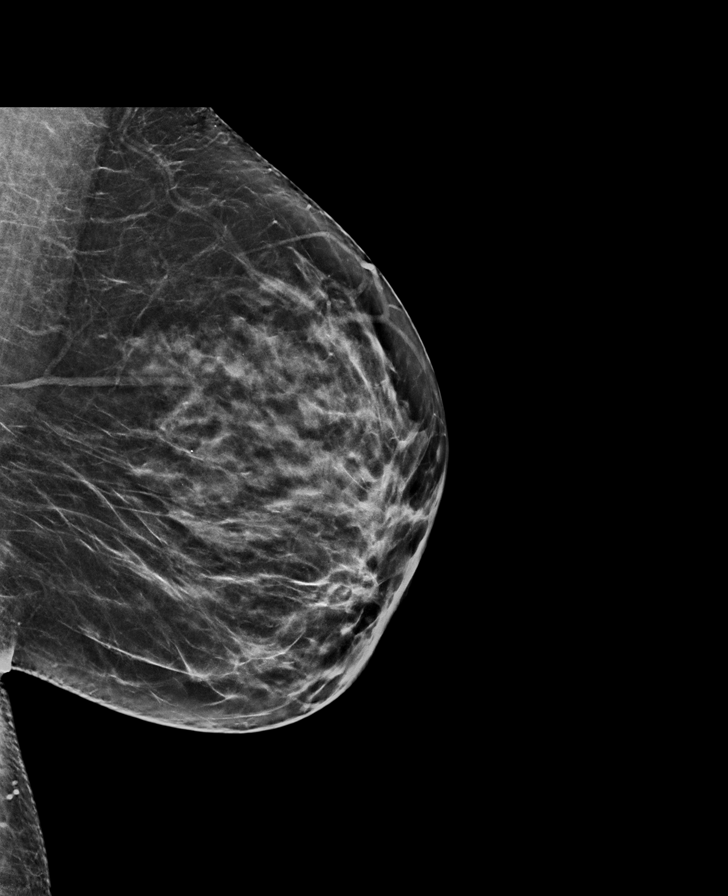

[R MLO synth-2D]
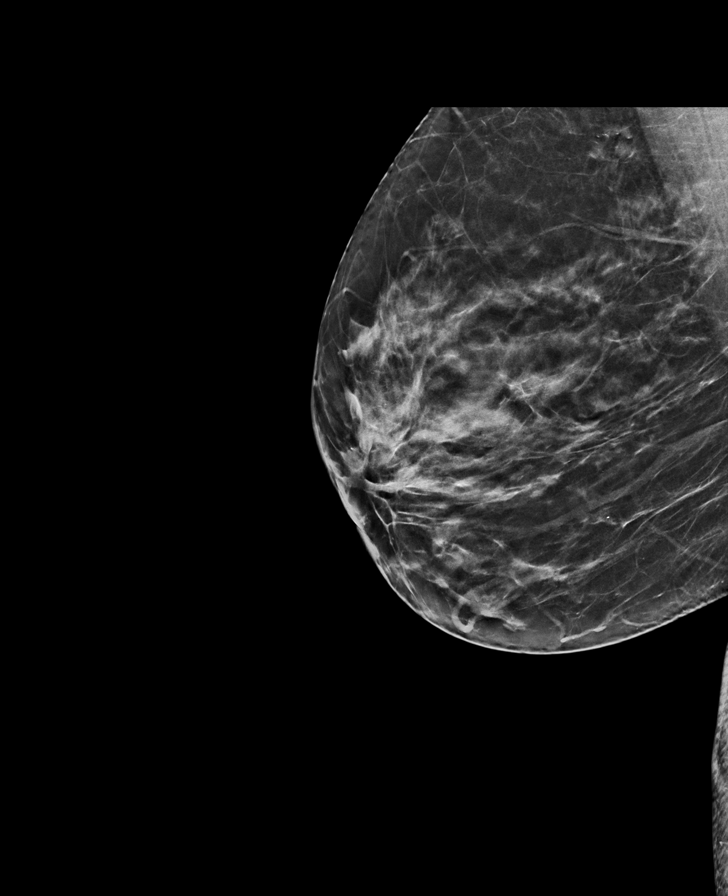

[L MLO tomo · tomo slice 34/67.0]
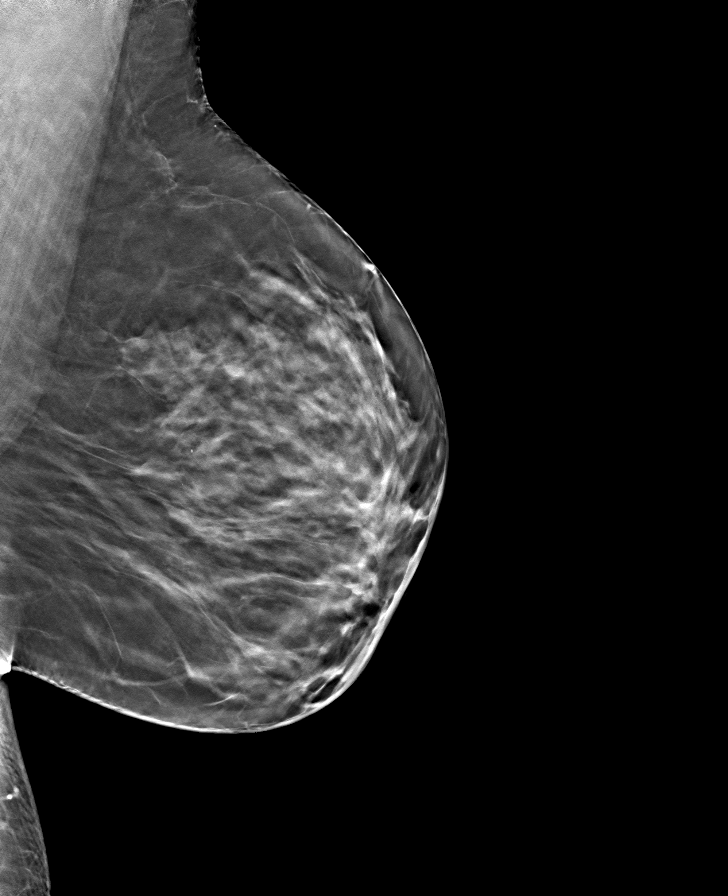

[R CC tomo · tomo slice 29/58.0]
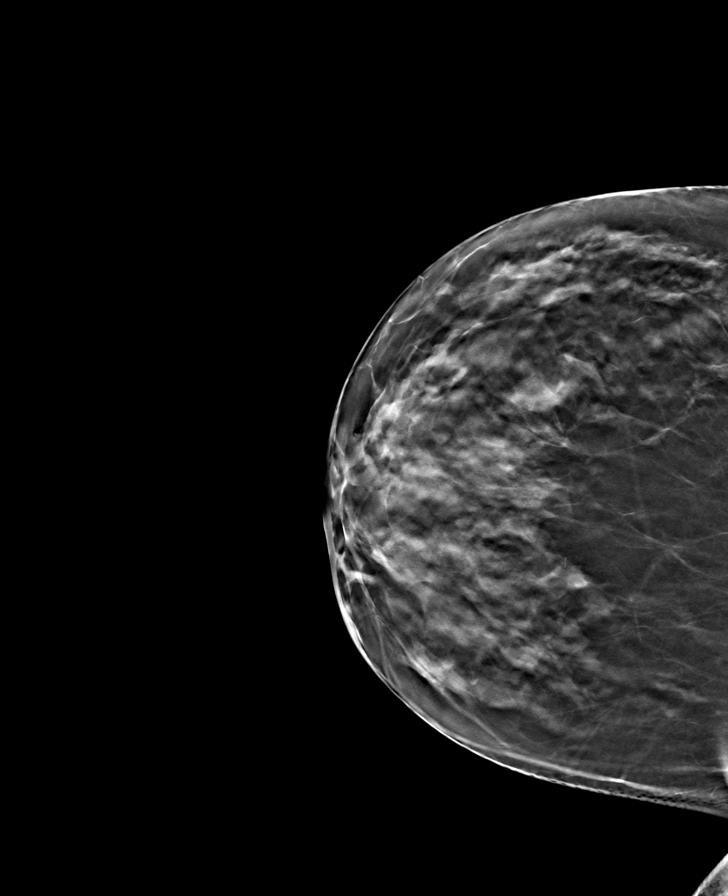

[R MLO tomo · tomo slice 32/63.0]
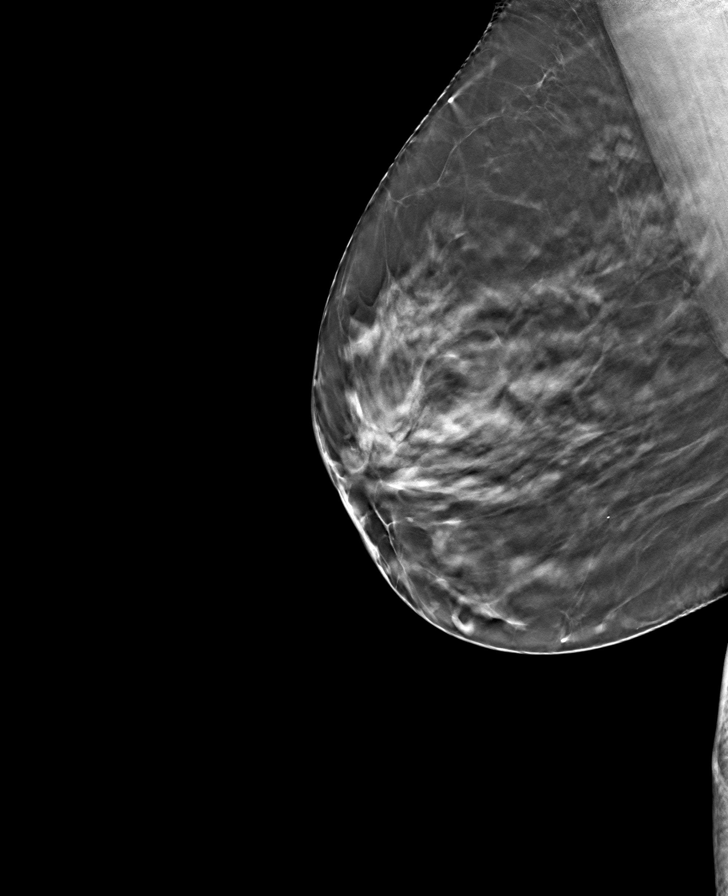

[L CC tomo · tomo slice 31/61.0]
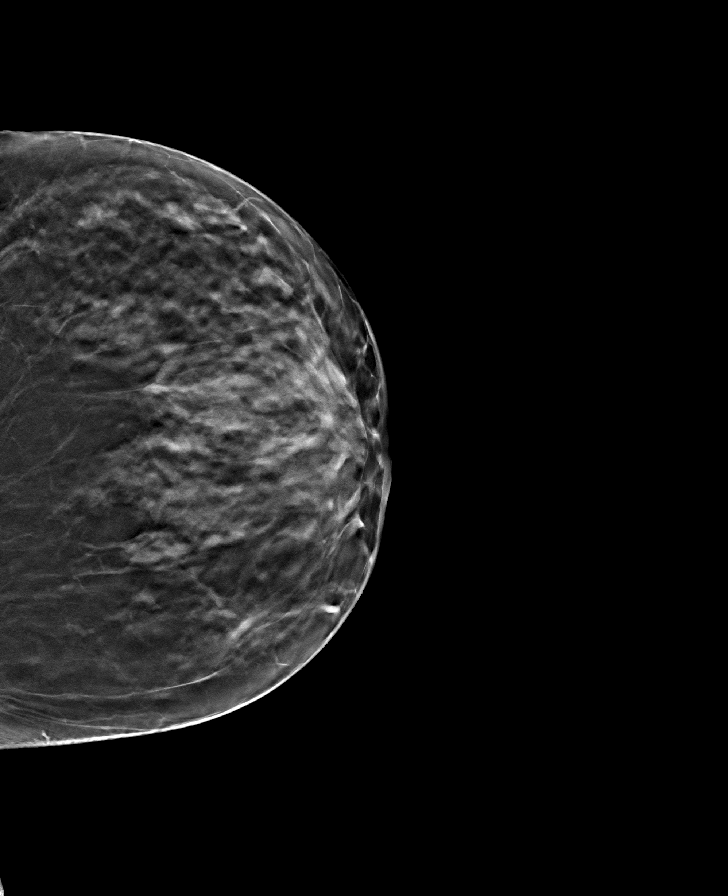

[8 of 24 positions shown; findings below may reference images not displayed]

ACR Breast Density Category c: The breast tissue is heterogeneously
dense, which may obscure small masses.
FINDINGS: In the right breast, a possible asymmetry associated with possible
vague architectural distortion visible only on the MLO images
warrants further evaluation. In the left breast, no findings
suspicious for malignancy. Images were processed with CAD.
IMPRESSION: Further evaluation is suggested for a possible asymmetry associated
with possible vague architectural distortion in the right breast.

RECOMMENDATION:
Diagnostic mammogram and possibly ultrasound of the right breast.
(Code:[id number])

The patient will be contacted regarding the findings, and additional
imaging will be scheduled.

BI-RADS CATEGORY  0: Incomplete. Need additional imaging evaluation
and/or prior mammograms for comparison.

## 2020-06-24 ENCOUNTER — Ambulatory Visit (HOSPITAL_COMMUNITY)
Admission: RE | Admit: 2020-06-24 | Discharge: 2020-06-24 | Disposition: A | Discharge status: Home / Self Care | Payer: BC Managed Care – PPO | Source: Ambulatory Visit | Attending: Internal Medicine | Admitting: Internal Medicine

## 2020-06-24 ENCOUNTER — Other Ambulatory Visit: Payer: Self-pay

## 2020-06-24 DIAGNOSIS — R928 Other abnormal and inconclusive findings on diagnostic imaging of breast: Secondary | ICD-10-CM

## 2020-10-07 ENCOUNTER — Encounter: Payer: Self-pay | Admitting: Nurse Practitioner

## 2020-10-11 ENCOUNTER — Telehealth: Payer: Self-pay | Admitting: *Deleted

## 2020-10-11 DIAGNOSIS — E039 Hypothyroidism, unspecified: Secondary | ICD-10-CM

## 2020-10-11 DIAGNOSIS — E782 Mixed hyperlipidemia: Secondary | ICD-10-CM

## 2020-10-11 NOTE — Telephone Encounter (Signed)
Pt has a new pt appt next week on 10/19/20, was a pt of Dr. Dolly Rias. She has 2 pills left of her Armour thyroid 90 mg. Can this be filled at [facility] until her appt. Last refill was on 06/02/20 #30 with 3 refills Please advise

## 2020-10-11 NOTE — Telephone Encounter (Signed)
NP thyroid is no longer recommended.  I need labs to adjust regimen. If she can come in prior to appointment to have Thyroid panel with TSH, I would be able to send in correct dose of synthroid

## 2020-10-12 ENCOUNTER — Encounter: Payer: Self-pay | Admitting: Family Medicine

## 2020-10-12 DIAGNOSIS — E039 Hypothyroidism, unspecified: Secondary | ICD-10-CM | POA: Insufficient documentation

## 2020-10-12 NOTE — Telephone Encounter (Signed)
CBC, CMP & Lipid ordered as well per Sharyn Lull.

## 2020-10-12 NOTE — Addendum Note (Signed)
Addended by: Antonietta Barcelona D on: 10/12/2020 11:39 AM   Modules accepted: Orders

## 2020-10-12 NOTE — Telephone Encounter (Signed)
Pt coming in tomorrow morning for a thyroid panel. Order pending, can you add a Dx please Is there any other labwork that you want done?

## 2020-10-12 NOTE — Addendum Note (Signed)
Addended by: Antonietta Barcelona D on: 10/12/2020 10:49 AM   Modules accepted: Orders

## 2020-10-13 ENCOUNTER — Other Ambulatory Visit: Payer: Self-pay

## 2020-10-13 ENCOUNTER — Other Ambulatory Visit: Payer: BC Managed Care – PPO

## 2020-10-13 DIAGNOSIS — E039 Hypothyroidism, unspecified: Secondary | ICD-10-CM

## 2020-10-13 DIAGNOSIS — E782 Mixed hyperlipidemia: Secondary | ICD-10-CM

## 2020-10-14 ENCOUNTER — Ambulatory Visit (INDEPENDENT_AMBULATORY_CARE_PROVIDER_SITE_OTHER): Payer: BC Managed Care – PPO | Admitting: Internal Medicine

## 2020-10-14 LAB — CBC WITH DIFFERENTIAL/PLATELET
Basophils Absolute: 0.1 10*3/uL (ref 0.0–0.2)
Basos: 1 %
EOS (ABSOLUTE): 0 10*3/uL (ref 0.0–0.4)
Eos: 1 %
Hematocrit: 42.3 % (ref 34.0–46.6)
Hemoglobin: 14.7 g/dL (ref 11.1–15.9)
Immature Grans (Abs): 0 10*3/uL (ref 0.0–0.1)
Immature Granulocytes: 0 %
Lymphocytes Absolute: 1.7 10*3/uL (ref 0.7–3.1)
Lymphs: 28 %
MCH: 30.9 pg (ref 26.6–33.0)
MCHC: 34.8 g/dL (ref 31.5–35.7)
MCV: 89 fL (ref 79–97)
Monocytes Absolute: 0.4 10*3/uL (ref 0.1–0.9)
Monocytes: 6 %
Neutrophils Absolute: 3.8 10*3/uL (ref 1.4–7.0)
Neutrophils: 64 %
Platelets: 190 10*3/uL (ref 150–450)
RBC: 4.76 x10E6/uL (ref 3.77–5.28)
RDW: 12.4 % (ref 11.7–15.4)
WBC: 5.9 10*3/uL (ref 3.4–10.8)

## 2020-10-14 LAB — CMP14+EGFR
ALT: 7 IU/L (ref 0–32)
AST: 10 IU/L (ref 0–40)
Albumin/Globulin Ratio: 1.8 (ref 1.2–2.2)
Albumin: 4.3 g/dL (ref 3.8–4.8)
Alkaline Phosphatase: 62 IU/L (ref 44–121)
BUN/Creatinine Ratio: 11 (ref 9–23)
BUN: 8 mg/dL (ref 6–24)
Bilirubin Total: 0.3 mg/dL (ref 0.0–1.2)
CO2: 22 mmol/L (ref 20–29)
Calcium: 9.6 mg/dL (ref 8.7–10.2)
Chloride: 102 mmol/L (ref 96–106)
Creatinine, Ser: 0.74 mg/dL (ref 0.57–1.00)
Globulin, Total: 2.4 g/dL (ref 1.5–4.5)
Glucose: 96 mg/dL (ref 70–99)
Potassium: 4.2 mmol/L (ref 3.5–5.2)
Sodium: 140 mmol/L (ref 134–144)
Total Protein: 6.7 g/dL (ref 6.0–8.5)
eGFR: 100 mL/min/{1.73_m2} (ref >59)

## 2020-10-14 LAB — LIPID PANEL
Chol/HDL Ratio: 4 ratio (ref 0.0–4.4)
Cholesterol, Total: 220 mg/dL — ABNORMAL HIGH (ref 100–199)
HDL: 55 mg/dL (ref >39)
LDL Chol Calc (NIH): 153 mg/dL — ABNORMAL HIGH (ref 0–99)
Triglycerides: 69 mg/dL (ref 0–149)
VLDL Cholesterol Cal: 12 mg/dL (ref 5–40)

## 2020-10-14 LAB — THYROID PANEL WITH TSH
Free Thyroxine Index: 1.7 (ref 1.2–4.9)
T3 Uptake Ratio: 25 % (ref 24–39)
T4, Total: 6.7 ug/dL (ref 4.5–12.0)
TSH: 0.448 u[IU]/mL — ABNORMAL LOW (ref 0.450–4.500)

## 2020-10-14 MED ORDER — LEVOTHYROXINE SODIUM 137 MCG PO TABS
137.0000 ug | ORAL_TABLET | Freq: Every day | ORAL | 3 refills | Status: DC
Start: 2020-10-14 — End: 2021-01-10

## 2020-10-19 ENCOUNTER — Ambulatory Visit: Payer: BC Managed Care – PPO | Admitting: Family Medicine

## 2020-10-19 ENCOUNTER — Other Ambulatory Visit: Payer: Self-pay

## 2020-10-19 ENCOUNTER — Encounter: Payer: Self-pay | Admitting: Family Medicine

## 2020-10-19 VITALS — BP 126/83 | HR 72 | Temp 97.7°F | Ht 63.0 in | Wt 157.0 lb

## 2020-10-19 DIAGNOSIS — Z1212 Encounter for screening for malignant neoplasm of rectum: Secondary | ICD-10-CM

## 2020-10-19 DIAGNOSIS — E559 Vitamin D deficiency, unspecified: Secondary | ICD-10-CM | POA: Diagnosis not present

## 2020-10-19 DIAGNOSIS — E782 Mixed hyperlipidemia: Secondary | ICD-10-CM | POA: Diagnosis not present

## 2020-10-19 DIAGNOSIS — H6121 Impacted cerumen, right ear: Secondary | ICD-10-CM

## 2020-10-19 DIAGNOSIS — E663 Overweight: Secondary | ICD-10-CM | POA: Diagnosis not present

## 2020-10-19 DIAGNOSIS — Z1211 Encounter for screening for malignant neoplasm of colon: Secondary | ICD-10-CM

## 2020-10-19 DIAGNOSIS — E039 Hypothyroidism, unspecified: Secondary | ICD-10-CM | POA: Diagnosis not present

## 2020-10-19 NOTE — Progress Notes (Signed)
Subjective:  Patient ID: Kimberly Brennan, female    DOB: [birth year], 50 y.o.   MRN: [id number]  Patient Care Team: Baruch Gouty, FNP as PCP - General (Family Medicine)   Chief Complaint:  New Patient (Initial Visit) (Establish care, medication management)   HPI: Kimberly Brennan is a 50 y.o. female presenting on 10/19/2020 for New Patient (Initial Visit) (Establish care, medication management)   Pt presents today to establish care with new PCP as her PCP unexpectedly passed away. She has acquired hypothyroidism, hyperlipidemia, and Vit D deficiency. She had labs 10/13/2020 which revealed LDL 153, total cholesterol 220, TSH 0.448, all other labs unremarkable. PAP and mammogram are up to date. She has not had a colorectal cancer screening. No known family members with colorectal cancer. She states overall she is well. She does have anxiety which she controls with stress management. She denies hyper- or hypothyroid symptoms. She has not been on anything for her cholesterol in the past and does not follow a diet or exercise routine. She has been taking Vit D repletion therapy and is tolerating well.  She does report slight loss of hearing in right ear, no other associated symptoms.    Relevant past medical, surgical, family, and social history reviewed and updated as indicated.  Allergies and medications reviewed and updated. Data reviewed: Chart in Epic.   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   Constipation 10/02/2018   HLD (hyperlipidemia) 10/02/2018   Overweight (BMI 25.0-29.9) 10/02/2018   Thyroid disease    Vaginal Pap smear, abnormal    Vitamin D deficiency disease 10/02/2018    Past Surgical History:  Procedure Laterality Date   CHOLECYSTECTOMY     LEEP     MOUTH SURGERY      Social History   Socioeconomic History   Marital status: Married    Spouse name: Not on file   Number of children: Not on file   Years of education: Not on  file   Highest education level: Not on file  Occupational History   Not on file  Tobacco Use   Smoking status: Never   Smokeless tobacco: Never  Vaping Use   Vaping Use: Never used  Substance and Sexual Activity   Alcohol use: No   Drug use: No   Sexual activity: Not Currently    Birth control/protection: None  Other Topics Concern   Not on file  Social History Narrative   Works as a Teacher, 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 Married for x 1 year. No children.   Social Determinants of Health   Financial Resource Strain: Not on file  Food Insecurity: Not on file  Transportation Needs: Not on file  Physical Activity: Not on file  Stress: Not on file  Social Connections: Not on file  Intimate Partner Violence: Not on file    Outpatient Encounter Medications as of 10/19/2020  Medication Sig   Cholecalciferol (VITAMIN D-3) 125 MCG (5000 UT) TABS Take 2 tablets by mouth daily.    levothyroxine (SYNTHROID) 137 MCG tablet Take 1 tablet (137 mcg total) by mouth daily before breakfast.   [DISCONTINUED] Probiotic, Lactobacillus, CAPS Take by mouth.   No facility-administered encounter medications on file as of 10/19/2020.    No Known Allergies  Review of Systems  Constitutional:  Negative for activity change, appetite change, chills, diaphoresis, fatigue, fever and unexpected weight change.  HENT:  Positive for hearing loss (right ear).   Eyes: Negative.  Negative for photophobia and visual disturbance.  Respiratory:  Negative for cough, chest tightness and shortness of breath.   Cardiovascular:  Negative for chest pain, palpitations and leg swelling.  Gastrointestinal:  Negative for abdominal pain, blood in stool, constipation, diarrhea, nausea and vomiting.  Endocrine: Negative.  Negative for cold intolerance, heat intolerance, polydipsia, polyphagia and polyuria.  Genitourinary:  Negative  for decreased urine volume, difficulty urinating, dysuria, frequency, menstrual problem and urgency.  Musculoskeletal:  Negative for arthralgias and myalgias.  Skin: Negative.   Allergic/Immunologic: Negative.   Neurological:  Negative for dizziness, tremors, seizures, syncope, facial asymmetry, speech difficulty, weakness, light-headedness, numbness and headaches.  Hematological: Negative.   Psychiatric/Behavioral:  Negative for confusion, hallucinations, sleep disturbance and suicidal ideas. The patient is nervous/anxious.   All other systems reviewed and are negative.      Objective:  BP 126/83   Pulse 72   Temp 97.7 F (36.5 C)   Ht 5' 3"  (1.6 m)   Wt 157 lb (71.2 kg)   LMP 09/28/2020 (Approximate)   SpO2 97%   BMI 27.81 kg/m    Wt Readings from Last 3 Encounters:  10/19/20 157 lb (71.2 kg)  04/14/20 165 lb (74.8 kg)  01/21/20 164 lb 6.4 oz (74.6 kg)    Physical Exam Vitals and nursing note reviewed.  Constitutional:      General: She is not in acute distress.    Appearance: Normal appearance. She is well-developed, well-groomed and overweight. She is not ill-appearing, toxic-appearing or diaphoretic.  HENT:     Head: Normocephalic and atraumatic.     Jaw: There is normal jaw occlusion.     Right Ear: Decreased hearing noted. There is impacted cerumen.     Left Ear: Hearing, tympanic membrane, ear canal and external ear normal.     Nose: Nose normal.     Mouth/Throat:     Lips: Pink.     Mouth: Mucous membranes are moist.     Pharynx: Oropharynx is clear. Uvula midline.  Eyes:     General: Lids are normal.     Extraocular Movements: Extraocular movements intact.     Conjunctiva/sclera: Conjunctivae normal.     Pupils: Pupils are equal, round, and reactive to light.  Neck:     Thyroid: No thyroid mass, thyromegaly or thyroid tenderness.     Vascular: No carotid bruit or JVD.     Trachea: Trachea and phonation normal.  Cardiovascular:     Rate and Rhythm:  Normal rate and regular rhythm.     Chest Wall: PMI is not displaced.     Pulses: Normal pulses.     Heart sounds: Normal heart sounds. No murmur heard.   No friction rub. No gallop.  Pulmonary:     Effort: Pulmonary effort is normal. No respiratory distress.     Breath sounds: Normal breath sounds. No wheezing.  Abdominal:     General: Bowel sounds are normal. There is no distension or abdominal bruit.     Palpations: Abdomen is soft. There is no hepatomegaly, splenomegaly or mass.     Tenderness: There is no abdominal tenderness. There is no right CVA tenderness, left CVA tenderness, guarding or rebound.     Hernia: No hernia is present.  Musculoskeletal:        General: Normal range of motion.     Cervical back: Normal range of motion and neck supple.  Right lower leg: No edema.     Left lower leg: No edema.  Lymphadenopathy:     Cervical: No cervical adenopathy.  Skin:    General: Skin is warm and dry.     Capillary Refill: Capillary refill takes less than 2 seconds.     Coloration: Skin is not cyanotic, jaundiced or pale.     Findings: No rash.  Neurological:     General: No focal deficit present.     Mental Status: She is alert and oriented to person, place, and time.     Cranial Nerves: Cranial nerves are intact. No cranial nerve deficit.     Sensory: Sensation is intact. No sensory deficit.     Motor: Motor function is intact. No weakness.     Coordination: Coordination is intact. Coordination normal.     Gait: Gait is intact. Gait normal.     Deep Tendon Reflexes: Reflexes are normal and symmetric. Reflexes normal.  Psychiatric:        Attention and Perception: Attention and perception normal.        Mood and Affect: Affect normal. Mood is anxious.        Speech: Speech normal.        Behavior: Behavior normal. Behavior is cooperative.        Thought Content: Thought content normal.        Cognition and Memory: Cognition and memory normal.        Judgment:  Judgment normal.    Results for orders placed or performed in visit on 10/13/20  Thyroid Panel With TSH  Result Value Ref Range   TSH 0.448 (L) 0.450 - 4.500 uIU/mL   T4, Total 6.7 4.5 - 12.0 ug/dL   T3 Uptake Ratio 25 24 - 39 %   Free Thyroxine Index 1.7 1.2 - 4.9  Lipid panel  Result Value Ref Range   Cholesterol, Total 220 (H) 100 - 199 mg/dL   Triglycerides 69 0 - 149 mg/dL   HDL 55 >39 mg/dL   VLDL Cholesterol Cal 12 5 - 40 mg/dL   LDL Chol Calc (NIH) 153 (H) 0 - 99 mg/dL   Chol/HDL Ratio 4.0 0.0 - 4.4 ratio  CMP14+EGFR  Result Value Ref Range   Glucose 96 70 - 99 mg/dL   BUN 8 6 - 24 mg/dL   Creatinine, Ser 0.74 0.57 - 1.00 mg/dL   eGFR 100 >59 mL/min/1.73   BUN/Creatinine Ratio 11 9 - 23   Sodium 140 134 - 144 mmol/L   Potassium 4.2 3.5 - 5.2 mmol/L   Chloride 102 96 - 106 mmol/L   CO2 22 20 - 29 mmol/L   Calcium 9.6 8.7 - 10.2 mg/dL   Total Protein 6.7 6.0 - 8.5 g/dL   Albumin 4.3 3.8 - 4.8 g/dL   Globulin, Total 2.4 1.5 - 4.5 g/dL   Albumin/Globulin Ratio 1.8 1.2 - 2.2   Bilirubin Total 0.3 0.0 - 1.2 mg/dL   Alkaline Phosphatase 62 44 - 121 IU/L   AST 10 0 - 40 IU/L   ALT 7 0 - 32 IU/L  CBC with Differential/Platelet  Result Value Ref Range   WBC 5.9 3.4 - 10.8 x10E3/uL   RBC 4.76 3.77 - 5.28 x10E6/uL   Hemoglobin 14.7 11.1 - 15.9 g/dL   Hematocrit 42.3 34.0 - 46.6 %   MCV 89 79 - 97 fL   MCH 30.9 26.6 - 33.0 pg   MCHC 34.8 31.5 - 35.7 g/dL   RDW  12.4 11.7 - 15.4 %   Platelets 190 150 - 450 x10E3/uL   Neutrophils 64 Not Estab. %   Lymphs 28 Not Estab. %   Monocytes 6 Not Estab. %   Eos 1 Not Estab. %   Basos 1 Not Estab. %   Neutrophils Absolute 3.8 1.4 - 7.0 x10E3/uL   Lymphocytes Absolute 1.7 0.7 - 3.1 x10E3/uL   Monocytes Absolute 0.4 0.1 - 0.9 x10E3/uL   EOS (ABSOLUTE) 0.0 0.0 - 0.4 x10E3/uL   Basophils Absolute 0.1 0.0 - 0.2 x10E3/uL   Immature Granulocytes 0 Not Estab. %   Immature Grans (Abs) 0.0 0.0 - 0.1 x10E3/uL     Ear Cerumen  Removal  Date/Time: 10/19/2020 9:30 AM Performed by: Baruch Gouty, FNP Authorized by: Baruch Gouty, FNP   Anesthesia: Local Anesthetic: none Location details: right ear Patient tolerance: patient tolerated the procedure well with no immediate complications Comments: TM normal post cerumen removal. Canal slightly erythematous post cerumen removal. Hearing returned to normal.  Procedure type: curette (irrigation)     Pertinent labs & imaging results that were available during my care of the patient were reviewed by me and considered in my medical decision making.  Assessment & Plan:  Chabeli was seen today for new patient (initial visit).  Diagnoses and all orders for this visit:  Acquired hypothyroidism Recently switched NP thyroid to synthroid and decreased dosing slightly due to low TSH. Follow up in 3 months for repeat labs.   Mixed hyperlipidemia Diet and exercise encouraged. Red Yeast Rice 2400 mg daily. Recheck in 6 months, if still elevated, will discuss further treatment options.   Vitamin D deficiency disease On repletion therapy. Last Vit D acceptable. Will continue to monitor.   Overweight (BMI 25.0-29.9) Diet and exercise encouraged.   Screening for colorectal cancer No indications of high risk. Will obtain cologuard.  -     Cologuard  Right ear impacted cerumen Cerumen removed in office. Pt tolerated well. Ear canal slightly irritated post cerumen removal, pt aware to report continued irritation. Will initiate antibiotic drops if warranted.  -     Ear Cerumen Removal    Continue all other maintenance medications.  Follow up plan: Return in about 3 months (around 01/19/2021), or if symptoms worsen or fail to improve, for TSH.   Continue healthy lifestyle choices, including diet (rich in fruits, vegetables, and lean proteins, and low in salt and simple carbohydrates) and exercise (at least 30 minutes of moderate physical activity daily).  Educational  handout given for cerumen build-up  The above assessment and management plan was discussed with the patient. The patient verbalized understanding of and has agreed to the management plan. Patient is aware to call the clinic if they develop any new symptoms or if symptoms persist or worsen. Patient is aware when to return to the clinic for a follow-up visit. Patient educated on when it is appropriate to go to the emergency department.   Monia Pouch, FNP-C [facility] [phone]

## 2020-10-19 NOTE — Patient Instructions (Signed)
Red Yeast Rice 2400 mg daily.

## 2020-11-03 LAB — COLOGUARD: COLOGUARD: NEGATIVE

## 2020-11-18 ENCOUNTER — Ambulatory Visit (INDEPENDENT_AMBULATORY_CARE_PROVIDER_SITE_OTHER): Payer: BC Managed Care – PPO | Admitting: Family Medicine

## 2020-11-18 ENCOUNTER — Encounter: Payer: Self-pay | Admitting: Family Medicine

## 2020-11-18 DIAGNOSIS — J069 Acute upper respiratory infection, unspecified: Secondary | ICD-10-CM

## 2020-11-18 DIAGNOSIS — J0140 Acute pansinusitis, unspecified: Secondary | ICD-10-CM

## 2020-11-18 MED ORDER — DOXYCYCLINE HYCLATE 100 MG PO TABS: 100.0000 mg | ORAL_TABLET | Freq: Two times a day (BID) | ORAL | 0 refills | Status: AC

## 2020-11-18 MED ORDER — FLUTICASONE PROPIONATE 50 MCG/ACT NA SUSP: 2.0000 | Freq: Every day | NASAL | 6 refills | Status: DC

## 2020-11-18 NOTE — Progress Notes (Signed)
Virtual Visit via telephone Note Due to COVID-19 pandemic this visit was conducted virtually. This visit type was conducted due to national recommendations for restrictions regarding the COVID-19 Pandemic (e.g. social distancing, sheltering in place) in an effort to limit this patient's exposure and mitigate transmission in our community. All issues noted in this document were discussed and addressed.  A physical exam was not performed with this format.   I connected with Kimberly Brennan on 11/18/2020 at 0800 by telephone and verified that I am speaking with the correct person using two identifiers. Kimberly Brennan is currently located at home and family is currently with them during visit. The provider, Monia Pouch, FNP is located in their office at time of visit.  I discussed the limitations, risks, security and privacy concerns of performing an evaluation and management service by telephone and the availability of in person appointments. I also discussed with the patient that there may be a patient responsible charge related to this service. The patient expressed understanding and agreed to proceed.  Subjective:  Patient ID: Kimberly Brennan, female    DOB: Oct 06, 1970, 50 y.o.   MRN: [id number]  Chief Complaint:  URI   HPI: Kimberly Brennan is a 50 y.o. female presenting on 11/18/2020 for URI   Pt reports ongoing URI and sinus congestion for the past 12 days. She has been using Mucinex, DayQuil and NyQuil without relief of symptoms. States now the drainage has become thicker and purulent and her throat has started to hurt more.   URI  This is a new problem. The current episode started 1 to 4 weeks ago. The problem has been gradually worsening. Associated symptoms include congestion, coughing, ear pain, headaches, a plugged ear sensation, rhinorrhea, sinus pain and a sore throat. Pertinent negatives include no abdominal pain, chest pain, diarrhea, dysuria, joint pain, joint swelling,  nausea, neck pain, rash, sneezing, swollen glands, vomiting or wheezing. She has tried decongestant, acetaminophen and NSAIDs for the symptoms. The treatment provided no relief.    Relevant past medical, surgical, family, and social history reviewed and updated as indicated.  Allergies and medications reviewed and updated.   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   Constipation 10/02/2018   HLD (hyperlipidemia) 10/02/2018   Overweight (BMI 25.0-29.9) 10/02/2018   Thyroid disease    Vaginal Pap smear, abnormal    Vitamin D deficiency disease 10/02/2018    Past Surgical History:  Procedure Laterality Date   CHOLECYSTECTOMY     LEEP     MOUTH SURGERY      Social History   Socioeconomic History   Marital status: Married    Spouse name: Not on file   Number of children: Not on file   Years of education: Not on file   Highest education level: Not on file  Occupational History   Not on file  Tobacco Use   Smoking status: Never   Smokeless tobacco: Never  Vaping Use   Vaping Use: Never used  Substance and Sexual Activity   Alcohol use: No   Drug use: No   Sexual activity: Not Currently    Birth control/protection: None  Other Topics Concern   Not on file  Social History Narrative   Works as a Teacher, 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 Married for x 1 year. No children.   Social Determinants of Health   Financial Resource Strain:  Not on file  Food Insecurity: Not on file  Transportation Needs: Not on file  Physical Activity: Not on file  Stress: Not on file  Social Connections: Not on file  Intimate Partner Violence: Not on file    Outpatient Encounter Medications as of 11/18/2020  Medication Sig   Cholecalciferol (VITAMIN D-3) 125 MCG (5000 UT) TABS Take 2 tablets by mouth daily.    doxycycline (VIBRA-TABS) 100 MG tablet Take 1 tablet (100 mg  total) by mouth 2 (two) times daily for 10 days. 1 po bid   fluticasone (FLONASE) 50 MCG/ACT nasal spray Place 2 sprays into both nostrils daily.   levothyroxine (SYNTHROID) 137 MCG tablet Take 1 tablet (137 mcg total) by mouth daily before breakfast.   No facility-administered encounter medications on file as of 11/18/2020.    No Known Allergies  Review of Systems  Constitutional:  Positive for activity change, chills and fatigue. Negative for appetite change, diaphoresis and unexpected weight change.  HENT:  Positive for congestion, ear pain, postnasal drip, rhinorrhea, sinus pressure, sinus pain, sore throat and voice change. Negative for dental problem, drooling, ear discharge, facial swelling, hearing loss, mouth sores, nosebleeds, sneezing, tinnitus and trouble swallowing.   Respiratory:  Positive for cough. Negative for shortness of breath and wheezing.   Cardiovascular:  Negative for chest pain, palpitations and leg swelling.  Gastrointestinal:  Negative for abdominal pain, diarrhea, nausea and vomiting.  Genitourinary:  Negative for decreased urine volume, difficulty urinating and dysuria.  Musculoskeletal:  Negative for joint pain and neck pain.  Skin:  Negative for rash.  Neurological:  Positive for headaches. Negative for dizziness, tremors, seizures, syncope, facial asymmetry, speech difficulty, weakness, light-headedness and numbness.  Psychiatric/Behavioral:  Negative for confusion.   All other systems reviewed and are negative.       Observations/Objective: No vital signs or physical exam, this was a telephone or virtual health encounter.  Pt alert and oriented, answers all questions appropriately, and able to speak in full sentences.    Assessment and Plan: Kimberly Brennan was seen today for uri.  Diagnoses and all orders for this visit:  URI with cough and congestion Acute non-recurrent pansinusitis Ongoing congestion, sinus pressure, headaches, sore throat, and  rhinorrhea for over 12 days. Has failed conservative therapy at home. Will add Flonase and Doxycycline to regimen. Pt aware to stay adequately hydrated and continue symptomatic care at home. Report any new, worsening, or persistent symptoms.  -     fluticasone (FLONASE) 50 MCG/ACT nasal spray; Place 2 sprays into both nostrils daily. -     doxycycline (VIBRA-TABS) 100 MG tablet; Take 1 tablet (100 mg total) by mouth 2 (two) times daily for 10 days. 1 po bid     Follow Up Instructions: Return if symptoms worsen or fail to improve.    I discussed the assessment and treatment plan with the patient. The patient was provided an opportunity to ask questions and all were answered. The patient agreed with the plan and demonstrated an understanding of the instructions.   The patient was advised to call back or seek an in-person evaluation if the symptoms worsen or if the condition fails to improve as anticipated.  The above assessment and management plan was discussed with the patient. The patient verbalized understanding of and has agreed to the management plan. Patient is aware to call the clinic if they develop any new symptoms or if symptoms persist or worsen. Patient is aware when to return to the clinic for a  follow-up visit. Patient educated on when it is appropriate to go to the emergency department.    I provided 12 minutes of non-face-to-face time during this encounter. The call started at 0800. The call ended at 0810. The other time was used for coordination of care.    Monia Pouch, FNP-C [facility] [address] [place], [place] [address] [phone] 11/18/2020

## 2021-01-10 ENCOUNTER — Other Ambulatory Visit: Payer: Self-pay | Admitting: Family Medicine

## 2021-01-10 DIAGNOSIS — E039 Hypothyroidism, unspecified: Secondary | ICD-10-CM

## 2021-01-19 ENCOUNTER — Ambulatory Visit: Payer: BC Managed Care – PPO | Admitting: Family Medicine

## 2021-01-19 ENCOUNTER — Encounter: Payer: Self-pay | Admitting: Family Medicine

## 2021-01-19 VITALS — BP 124/80 | HR 68 | Temp 98.5°F | Ht 63.0 in | Wt 154.0 lb

## 2021-01-19 DIAGNOSIS — F411 Generalized anxiety disorder: Secondary | ICD-10-CM

## 2021-01-19 DIAGNOSIS — F321 Major depressive disorder, single episode, moderate: Secondary | ICD-10-CM | POA: Diagnosis not present

## 2021-01-19 DIAGNOSIS — E039 Hypothyroidism, unspecified: Secondary | ICD-10-CM

## 2021-01-19 MED ORDER — FLUOXETINE HCL 20 MG PO CAPS: 20.0000 mg | ORAL_CAPSULE | Freq: Every day | ORAL | 3 refills | Status: DC

## 2021-01-19 NOTE — Patient Instructions (Signed)
If your symptoms worsen or you have thoughts of suicide/homicide, PLEASE SEEK IMMEDIATE MEDICAL ATTENTION.  You may always call the National Suicide Hotline.  This is available 24 hours a day, 7 days a week.  Their number is: (475)348-2244  Taking the medicine as directed and not missing any doses is one of the best things you can do to treat your depression.  Here are some things to keep in mind:  Side effects (stomach upset, some increased anxiety) may happen before you notice a benefit.  These side effects typically go away over time. Changes to your dose of medicine or a change in medication all together is sometimes necessary Most people need to be on medication at least 12 months Many people will notice an improvement within two weeks but the full effect of the medication can take up to 4-6 weeks Stopping the medication when you start feeling better often results in a return of symptoms Never discontinue your medication without contacting a health care professional first.  Some medications require gradual discontinuation/ taper and can make you sick if you stop them abruptly.  If your symptoms worsen or you have thoughts of suicide/homicide, PLEASE SEEK IMMEDIATE MEDICAL ATTENTION.  You may always call:  National Suicide Hotline: 775 129 2668 [place]: 508 037 9065 [facility] in [place]: (321)370-5634  These are available 24 hours a day, 7 days a week.

## 2021-01-19 NOTE — Progress Notes (Signed)
Subjective:  Patient ID: Kimberly Brennan, female    DOB: 1970/08/25, 51 y.o.   MRN: [id number]  Patient Care Team: Baruch Gouty, FNP as PCP - General (Family Medicine)   Chief Complaint:  Thyroid Problem (Med check/depression)   HPI: Kimberly Brennan is a 51 y.o. female presenting on 01/19/2021 for Thyroid Problem (Med check/depression)   Thyroid Problem Presents for follow-up (NP thyroid switched to synthroid in 10/2020, has been tolerating well) visit. Symptoms include anxiety, fatigue and weight loss. Patient reports no cold intolerance, constipation, depressed mood, diaphoresis, diarrhea, dry skin, hair loss, heat intolerance, hoarse voice, leg swelling, menstrual problem, nail problem, palpitations, tremors, visual change or weight gain.  Depression      The patient presents with depression.  This is a recurrent problem.  The current episode started more than 1 year ago.   The problem occurs daily.  The problem has been gradually worsening since onset.  Associated symptoms include decreased concentration, fatigue, helplessness, hopelessness, insomnia, irritable, decreased interest, appetite change and sad.  Associated symptoms include no restlessness, no body aches, no myalgias, no headaches, no indigestion and no suicidal ideas.     The symptoms are aggravated by family issues and work stress.  Past treatments include SSRIs - Selective serotonin reuptake inhibitors (treated in 2004).  Compliance with treatment is poor.  Previous treatment provided significant relief.  Risk factors include family history and prior traumatic experience.   Past medical history includes hypothyroidism, thyroid problem, anxiety and depression.     Pertinent negatives include , no chronic pain, no fibromyalgia, no chronic illness, no recent illness, no life-threatening condition, no physical disability, no terminal illness, no recent psychiatric admission, no Alzheimer's disease, no brain trauma, no dementia,  no bipolar disorder, no eating disorder, , no obsessive-compulsive disorder, no post-traumatic stress disorder, no schizophrenia, no suicide attempts and no head trauma. Anxiety Presents for follow-up visit. Symptoms include decreased concentration, excessive worry, insomnia, irritability, malaise and nervous/anxious behavior. Patient reports no chest pain, compulsions, confusion, depressed mood, dizziness, dry mouth, feeling of choking, hyperventilation, impotence, muscle tension, nausea, obsessions, palpitations, panic, restlessness, shortness of breath or suicidal ideas. Symptoms occur most days. The severity of symptoms is moderate and interfering with daily activities. The quality of sleep is fair. Nighttime awakenings: one to two.   Her past medical history is significant for depression. There is no history of suicide attempts.   GAD 7 : Generalized Anxiety Score 01/19/2021 10/19/2020  Nervous, Anxious, on Edge 3 2  Control/stop worrying 2 2  Worry too much - different things 2 1  Trouble relaxing 1 1  Restless 1 1  Easily annoyed or irritable 3 1  Afraid - awful might happen 3 1  Total GAD 7 Score 15 9  Anxiety Difficulty - Somewhat difficult    Depression screen [facility] 2/9 01/19/2021 10/19/2020 04/14/2020 01/21/2020 05/15/2019  Decreased Interest 2 1 0 0 0  Down, Depressed, Hopeless 2 1 0 0 0  PHQ - 2 Score 4 2 0 0 0  Altered sleeping 1 1 0 0 -  Tired, decreased energy 1 1 0 0 -  Change in appetite 1 0 0 0 -  Feeling bad or failure about yourself  3 1 0 0 -  Trouble concentrating 1 0 0 0 -  Moving slowly or fidgety/restless 2 1 0 0 -  Suicidal thoughts 0 0 0 0 -  PHQ-9 Score 13 6 0 0 -  Difficult doing work/chores Somewhat  difficult Not difficult at all Not difficult at all Not difficult at all -     Relevant past medical, surgical, family, and social history reviewed and updated as indicated.  Allergies and medications reviewed and updated. Data reviewed: Chart in Epic.   Past  Medical History:  Diagnosis Date   Cold intolerance 10/02/2018   Constipation 10/02/2018   HLD (hyperlipidemia) 10/02/2018   Overweight (BMI 25.0-29.9) 10/02/2018   Thyroid disease    Vaginal Pap smear, abnormal    Vitamin D deficiency disease 10/02/2018    Past Surgical History:  Procedure Laterality Date   CHOLECYSTECTOMY     LEEP     MOUTH SURGERY      Social History   Socioeconomic History   Marital status: Married    Spouse name: Not on file   Number of children: Not on file   Years of education: Not on file   Highest education level: Not on file  Occupational History   Not on file  Tobacco Use   Smoking status: Never   Smokeless tobacco: Never  Vaping Use   Vaping Use: Never used  Substance and Sexual Activity   Alcohol use: No   Drug use: No   Sexual activity: Not Currently    Birth control/protection: None  Other Topics Concern   Not on file  Social History Narrative   Works as a Teacher, 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 Married for x 1 year. No children.   Social Determinants of Health   Financial Resource Strain: Not on file  Food Insecurity: Not on file  Transportation Needs: Not on file  Physical Activity: Not on file  Stress: Not on file  Social Connections: Not on file  Intimate Partner Violence: Not on file    Outpatient Encounter Medications as of 01/19/2021  Medication Sig   Cholecalciferol (VITAMIN D-3) 125 MCG (5000 UT) TABS Take 2 tablets by mouth daily.    FLUoxetine (PROZAC) 20 MG capsule Take 1 capsule (20 mg total) by mouth daily.   fluticasone (FLONASE) 50 MCG/ACT nasal spray Place 2 sprays into both nostrils daily.   levothyroxine (SYNTHROID) 137 MCG tablet TAKE 1 TABLET BY MOUTH DAILY BEFORE BREAKFAST.   Red Yeast Rice Extract (RED YEAST RICE PO) Take by mouth.   No facility-administered encounter medications on file as of 01/19/2021.    No  Known Allergies  Review of Systems  Constitutional:  Positive for activity change, appetite change, fatigue, irritability and weight loss. Negative for chills, diaphoresis, fever, unexpected weight change and weight gain.  HENT:  Negative for hoarse voice.   Respiratory:  Negative for cough and shortness of breath.   Cardiovascular:  Negative for chest pain, palpitations and leg swelling.  Gastrointestinal:  Negative for abdominal pain, constipation, diarrhea, nausea and vomiting.  Endocrine: Negative for cold intolerance and heat intolerance.  Genitourinary:  Negative for decreased urine volume, difficulty urinating, impotence and menstrual problem.  Musculoskeletal:  Negative for myalgias.  Neurological:  Negative for dizziness, tremors, seizures, syncope, facial asymmetry, speech difficulty, weakness, light-headedness, numbness and headaches.  Psychiatric/Behavioral:  Positive for agitation, decreased concentration, depression, dysphoric mood and sleep disturbance. Negative for behavioral problems, confusion, hallucinations, self-injury and suicidal ideas. The patient is nervous/anxious and has insomnia. The patient is not hyperactive.   All other systems reviewed and are negative.      Objective:  BP 124/80    Pulse 68  Temp 98.5 F (36.9 C)    Ht 5' 3"  (1.6 m)    Wt 154 lb (69.9 kg)    LMP 01/12/2021 (Approximate)    SpO2 96%    BMI 27.28 kg/m    Wt Readings from Last 3 Encounters:  01/19/21 154 lb (69.9 kg)  10/19/20 157 lb (71.2 kg)  04/14/20 165 lb (74.8 kg)    Physical Exam Vitals and nursing note reviewed.  Constitutional:      General: She is irritable. She is not in acute distress.    Appearance: Normal appearance. She is overweight. She is not ill-appearing, toxic-appearing or diaphoretic.  HENT:     Head: Normocephalic and atraumatic.     Mouth/Throat:     Mouth: Mucous membranes are moist.  Eyes:     Conjunctiva/sclera: Conjunctivae normal.     Pupils: Pupils  are equal, round, and reactive to light.  Cardiovascular:     Rate and Rhythm: Normal rate and regular rhythm.     Heart sounds: Normal heart sounds. No murmur heard.   No friction rub. No gallop.  Pulmonary:     Effort: Pulmonary effort is normal.     Breath sounds: Normal breath sounds.  Musculoskeletal:     Cervical back: Neck supple.  Skin:    General: Skin is warm and dry.     Capillary Refill: Capillary refill takes less than 2 seconds.  Neurological:     General: No focal deficit present.     Mental Status: She is alert and oriented to person, place, and time.  Psychiatric:        Attention and Perception: Attention and perception normal.        Mood and Affect: Mood is depressed. Affect is flat.        Speech: Speech normal.        Behavior: Behavior is withdrawn. Behavior is cooperative.        Thought Content: Thought content normal.        Cognition and Memory: Cognition and memory normal.        Judgment: Judgment normal.    Results for orders placed or performed in visit on 10/19/20  Cologuard  Result Value Ref Range   COLOGUARD Negative Negative       Pertinent labs & imaging results that were available during my care of the patient were reviewed by me and considered in my medical decision making.  Assessment & Plan:  Kimberly Brennan was seen today for thyroid problem.  Diagnoses and all orders for this visit:  Acquired hypothyroidism Has been tolerating synthroid well. Will recheck thyroid function today and adjust regimen if warranted.  -     Thyroid Panel With TSH  Depression, major, single episode, moderate (HCC) GAD (generalized anxiety disorder) Ongoing and worsening symptoms. No suicidal ideations or plans. Will recheck  thyroid function. Declined referral to counselor. Will initiate below. Pt to follow up in 2 weeks or sooner if warranted.  -     Thyroid Panel With TSH -     FLUoxetine (PROZAC) 20 MG capsule; Take 1 capsule (20 mg total) by mouth  daily.     Continue all other maintenance medications.  Follow up plan: Return in about 2 weeks (around 02/02/2021), or if symptoms worsen or fail to improve, for depression.   Continue healthy lifestyle choices, including diet (rich in fruits, vegetables, and lean proteins, and low in salt and simple carbohydrates) and exercise (at least 30 minutes of moderate physical activity  daily).  Educational handout given for depression with crisis hotline numbers  The above assessment and management plan was discussed with the patient. The patient verbalized understanding of and has agreed to the management plan. Patient is aware to call the clinic if they develop any new symptoms or if symptoms persist or worsen. Patient is aware when to return to the clinic for a follow-up visit. Patient educated on when it is appropriate to go to the emergency department.   Monia Pouch, FNP-C [facility] [phone]

## 2021-01-20 ENCOUNTER — Encounter: Payer: Self-pay | Admitting: Family Medicine

## 2021-01-20 LAB — THYROID PANEL WITH TSH
Free Thyroxine Index: 3.7 (ref 1.2–4.9)
T3 Uptake Ratio: 32 % (ref 24–39)
T4, Total: 11.5 ug/dL (ref 4.5–12.0)
TSH: 0.013 u[IU]/mL — ABNORMAL LOW (ref 0.450–4.500)

## 2021-01-20 MED ORDER — LEVOTHYROXINE SODIUM 125 MCG PO TABS: 125.0000 ug | ORAL_TABLET | Freq: Every day | ORAL | 3 refills | Status: DC

## 2021-01-20 NOTE — Addendum Note (Signed)
Addended by: Baruch Gouty on: 01/20/2021 07:55 AM   Modules accepted: Orders

## 2021-02-02 ENCOUNTER — Encounter: Payer: Self-pay | Admitting: Family Medicine

## 2021-02-02 ENCOUNTER — Other Ambulatory Visit: Payer: Self-pay | Admitting: Family Medicine

## 2021-02-02 ENCOUNTER — Ambulatory Visit: Payer: BC Managed Care – PPO | Admitting: Family Medicine

## 2021-02-02 VITALS — BP 122/74 | HR 65 | Temp 97.7°F | Ht 63.0 in | Wt 155.0 lb

## 2021-02-02 DIAGNOSIS — F321 Major depressive disorder, single episode, moderate: Secondary | ICD-10-CM | POA: Diagnosis not present

## 2021-02-02 DIAGNOSIS — F411 Generalized anxiety disorder: Secondary | ICD-10-CM | POA: Diagnosis not present

## 2021-02-02 DIAGNOSIS — E039 Hypothyroidism, unspecified: Secondary | ICD-10-CM

## 2021-02-02 NOTE — Progress Notes (Signed)
Subjective:  Patient ID: Kimberly Brennan, female    DOB: 11-Mar-1970, 51 y.o.   MRN: [id number]  Patient Care Team: Baruch Gouty, FNP as PCP - General (Family Medicine)   Chief Complaint:  Depression   HPI: Kimberly Brennan is a 51 y.o. female presenting on 02/02/2021 for Depression   Pt presents today for 2 week follow up after initiation of fluoxetine for anxiety and depression. States she had a headache at first but this has since resolved. States her depressive and anxiety symptoms have improved greatly with initiation of medications. No SI or HI.   Depression screen [facility] 2/9 02/02/2021 01/19/2021 10/19/2020 04/14/2020 01/21/2020  Decreased Interest 1 2 1  0 0  Down, Depressed, Hopeless 1 2 1  0 0  PHQ - 2 Score 2 4 2  0 0  Altered sleeping 0 1 1 0 0  Tired, decreased energy 0 1 1 0 0  Change in appetite 0 1 0 0 0  Feeling bad or failure about yourself  0 3 1 0 0  Trouble concentrating 0 1 0 0 0  Moving slowly or fidgety/restless 0 2 1 0 0  Suicidal thoughts 0 0 0 0 0  PHQ-9 Score 2 13 6  0 0  Difficult doing work/chores Not difficult at all Somewhat difficult Not difficult at all Not difficult at all Not difficult at all   GAD 7 : Generalized Anxiety Score 02/02/2021 01/19/2021 10/19/2020  Nervous, Anxious, on Edge 1 3 2   Control/stop worrying 0 2 2  Worry too much - different things 0 2 1  Trouble relaxing 1 1 1   Restless 0 1 1  Easily annoyed or irritable 1 3 1   Afraid - awful might happen 0 3 1  Total GAD 7 Score 3 15 9   Anxiety Difficulty Not difficult at all - Somewhat difficult       Relevant past medical, surgical, family, and social history reviewed and updated as indicated.  Allergies and medications reviewed and updated. Data reviewed: Chart in Epic.   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   Constipation 10/02/2018   HLD (hyperlipidemia) 10/02/2018   Overweight (BMI 25.0-29.9) 10/02/2018   Thyroid disease    Vaginal Pap  smear, abnormal    Vitamin D deficiency disease 10/02/2018    Past Surgical History:  Procedure Laterality Date   CHOLECYSTECTOMY     LEEP     MOUTH SURGERY      Social History   Socioeconomic History   Marital status: Married    Spouse name: Not on file   Number of children: Not on file   Years of education: Not on file   Highest education level: Not on file  Occupational History   Not on file  Tobacco Use   Smoking status: Never   Smokeless tobacco: Never  Vaping Use   Vaping Use: Never used  Substance and Sexual Activity   Alcohol use: No   Drug use: No   Sexual activity: Not Currently    Birth control/protection: None  Other Topics Concern   Not on file  Social History Narrative   Works as a Teacher, 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 Married for x 1 year. No children.   Social Determinants of Health   Financial Resource Strain: Not on file  Food Insecurity: Not on file  Transportation Needs: Not on file  Physical Activity:  Not on file  Stress: Not on file  Social Connections: Not on file  Intimate Partner Violence: Not on file    Outpatient Encounter Medications as of 02/02/2021  Medication Sig   Cholecalciferol (VITAMIN D-3) 125 MCG (5000 UT) TABS Take 1 tablet by mouth daily.   FLUoxetine (PROZAC) 20 MG capsule Take 1 capsule (20 mg total) by mouth daily.   fluticasone (FLONASE) 50 MCG/ACT nasal spray Place 2 sprays into both nostrils daily.   levothyroxine (SYNTHROID) 125 MCG tablet Take 1 tablet (125 mcg total) by mouth daily.   Red Yeast Rice Extract (RED YEAST RICE PO) Take by mouth.   No facility-administered encounter medications on file as of 02/02/2021.    No Known Allergies  Review of Systems  Respiratory:  Negative for cough.   All other systems reviewed and are negative.      Objective:  BP 122/74    Pulse 65    Temp 97.7 F (36.5 C) (Temporal)    Ht  5' 3"  (1.6 m)    Wt 155 lb (70.3 kg)    LMP 01/12/2021 (Approximate)    SpO2 97%    BMI 27.46 kg/m    Wt Readings from Last 3 Encounters:  02/02/21 155 lb (70.3 kg)  01/19/21 154 lb (69.9 kg)  10/19/20 157 lb (71.2 kg)    Physical Exam Vitals and nursing note reviewed.  Constitutional:      General: She is not in acute distress.    Appearance: Normal appearance. She is well-developed and well-groomed. She is not ill-appearing, toxic-appearing or diaphoretic.  HENT:     Head: Normocephalic and atraumatic.     Jaw: There is normal jaw occlusion.     Right Ear: Hearing normal.     Left Ear: Hearing normal.     Mouth/Throat:     Lips: Pink.     Mouth: Mucous membranes are moist.     Pharynx: Uvula midline.  Eyes:     General: Lids are normal.     Extraocular Movements: Extraocular movements intact.     Conjunctiva/sclera: Conjunctivae normal.     Pupils: Pupils are equal, round, and reactive to light.  Neck:     Thyroid: No thyroid mass, thyromegaly or thyroid tenderness.     Vascular: No carotid bruit or JVD.     Trachea: Trachea and phonation normal.  Cardiovascular:     Rate and Rhythm: Regular rhythm.     Chest Wall: PMI is not displaced.     Pulses: Normal pulses.     Heart sounds: Normal heart sounds. No murmur heard.   No friction rub. No gallop.  Pulmonary:     Effort: Pulmonary effort is normal.     Breath sounds: Normal breath sounds.  Abdominal:     General: There is no abdominal bruit.     Palpations: There is no hepatomegaly or splenomegaly.  Musculoskeletal:     Cervical back: Normal range of motion and neck supple.     Right lower leg: No edema.     Left lower leg: No edema.  Lymphadenopathy:     Cervical: No cervical adenopathy.  Skin:    General: Skin is warm and dry.     Capillary Refill: Capillary refill takes less than 2 seconds.     Coloration: Skin is not cyanotic, jaundiced or pale.     Findings: No rash.  Neurological:     General: No focal  deficit present.     Mental Status:  She is alert and oriented to person, place, and time.     Sensory: Sensation is intact.     Motor: Motor function is intact.     Coordination: Coordination is intact.     Gait: Gait is intact.     Deep Tendon Reflexes: Reflexes are normal and symmetric.  Psychiatric:        Attention and Perception: Attention and perception normal.        Mood and Affect: Mood and affect normal.        Speech: Speech normal.        Behavior: Behavior normal. Behavior is cooperative.        Thought Content: Thought content normal.        Cognition and Memory: Cognition and memory normal.        Judgment: Judgment normal.    Results for orders placed or performed in visit on 01/19/21  Thyroid Panel With TSH  Result Value Ref Range   TSH 0.013 (L) 0.450 - 4.500 uIU/mL   T4, Total 11.5 4.5 - 12.0 ug/dL   T3 Uptake Ratio 32 24 - 39 %   Free Thyroxine Index 3.7 1.2 - 4.9       Pertinent labs & imaging results that were available during my care of the patient were reviewed by me and considered in my medical decision making.  Assessment & Plan:  Ninoska was seen today for depression.  Diagnoses and all orders for this visit:  Depression, major, single episode, moderate (HCC) GAD (generalized anxiety disorder) Doing great on fluoxetine, no adjustment in regimen. Pt aware to report any new or worsening symptoms. Follow up in 3 months or sooner if warranted.     Continue all other maintenance medications.  Follow up plan: Return in about 3 months (around 05/02/2021), or if symptoms worsen or fail to improve, for Thyroid.   Continue healthy lifestyle choices, including diet (rich in fruits, vegetables, and lean proteins, and low in salt and simple carbohydrates) and exercise (at least 30 minutes of moderate physical activity daily).  Educational handout given for depression  The above assessment and management plan was discussed with the patient. The patient  verbalized understanding of and has agreed to the management plan. Patient is aware to call the clinic if they develop any new symptoms or if symptoms persist or worsen. Patient is aware when to return to the clinic for a follow-up visit. Patient educated on when it is appropriate to go to the emergency department.   Monia Pouch, FNP-C [facility] [phone]

## 2021-02-02 NOTE — Patient Instructions (Signed)
If your symptoms worsen or you have thoughts of suicide/homicide, PLEASE SEEK IMMEDIATE MEDICAL ATTENTION.  You may always call the National Suicide Hotline.  This is available 24 hours a day, 7 days a week.  Their number is: 909-704-6236  Taking the medicine as directed and not missing any doses is one of the best things you can do to treat your depression.  Here are some things to keep in mind:  Side effects (stomach upset, some increased anxiety) may happen before you notice a benefit.  These side effects typically go away over time. Changes to your dose of medicine or a change in medication all together is sometimes necessary Most people need to be on medication at least 12 months Many people will notice an improvement within two weeks but the full effect of the medication can take up to 4-6 weeks Stopping the medication when you start feeling better often results in a return of symptoms Never discontinue your medication without contacting a health care professional first.  Some medications require gradual discontinuation/ taper and can make you sick if you stop them abruptly.  If your symptoms worsen or you have thoughts of suicide/homicide, PLEASE SEEK IMMEDIATE MEDICAL ATTENTION.  You may always call:  National Suicide Hotline: 208-171-6260 [place]: 928-735-0569 Crisis Recovery in [place]: 919-560-5390  These are available 24 hours a day, 7 days a week.

## 2021-05-10 ENCOUNTER — Ambulatory Visit: Payer: BC Managed Care – PPO | Admitting: Family Medicine

## 2021-05-10 ENCOUNTER — Encounter: Payer: Self-pay | Admitting: Family Medicine

## 2021-05-10 VITALS — BP 110/72 | HR 62 | Temp 98.3°F | Ht 63.0 in | Wt 153.0 lb

## 2021-05-10 DIAGNOSIS — E039 Hypothyroidism, unspecified: Secondary | ICD-10-CM

## 2021-05-10 DIAGNOSIS — F411 Generalized anxiety disorder: Secondary | ICD-10-CM | POA: Diagnosis not present

## 2021-05-10 DIAGNOSIS — F321 Major depressive disorder, single episode, moderate: Secondary | ICD-10-CM

## 2021-05-10 DIAGNOSIS — J301 Allergic rhinitis due to pollen: Secondary | ICD-10-CM | POA: Diagnosis not present

## 2021-05-10 NOTE — Progress Notes (Signed)
Subjective:  Patient ID: Kimberly Brennan, female    DOB: November 26, 1970, 51 y.o.   MRN: [id number]  Patient Care Team: Sonny Masters, FNP as PCP - General (Family Medicine)   Chief Complaint:  Medical Management of Chronic Issues   HPI: Kimberly Brennan is a 51 y.o. female presenting on 05/10/2021 for Medical Management of Chronic Issues   1. Depression, major, single episode, moderate (HCC) 2. GAD (generalized anxiety disorder) Currently on fluoxetine and tolerating well. Great improvement in symptoms. No SI or HI.     05/10/2021   10:08 AM 02/02/2021    8:37 AM 01/19/2021    8:45 AM 10/19/2020    9:20 AM  GAD 7 : Generalized Anxiety Score  Nervous, Anxious, on Edge 0 1 3 2   Control/stop worrying 0 0 2 2  Worry too much - different things 0 0 2 1  Trouble relaxing 0 1 1 1   Restless 0 0 1 1  Easily annoyed or irritable 0 1 3 1   Afraid - awful might happen 0 0 3 1  Total GAD 7 Score 0 3 15 9   Anxiety Difficulty Not difficult at all Not difficult at all  Somewhat difficult       05/10/2021   10:07 AM 02/02/2021    8:36 AM 01/19/2021    8:46 AM 10/19/2020    9:19 AM 04/14/2020   11:19 AM  Depression screen PHQ 2/9  Decreased Interest 0 1 2 1  0  Down, Depressed, Hopeless 0 1 2 1  0  PHQ - 2 Score 0 2 4 2  0  Altered sleeping 0 0 1 1 0  Tired, decreased energy 1 0 1 1 0  Change in appetite 0 0 1 0 0  Feeling bad or failure about yourself  0 0 3 1 0  Trouble concentrating 1 0 1 0 0  Moving slowly or fidgety/restless 0 0 2 1 0  Suicidal thoughts 0 0 0 0 0  PHQ-9 Score 2 2 13 6  0  Difficult doing work/chores Not difficult at all Not difficult at all Somewhat difficult Not difficult at all Not difficult at all     3. Acquired hypothyroidism Last TSH was low and synthroid was decreased to 125 mcg. Pt has tolerated this change well. No hyper- or hypothyroid symptoms reported. Has been complaint with medications and taking on empty stomach every morning as prescribed.   She also  reports some rhinorrhea and intermittent pressure in the front of her head. She has only been using Flonase as needed, not daily. States when she does use it, her headache improves.     Relevant past medical, surgical, family, and social history reviewed and updated as indicated.  Allergies and medications reviewed and updated. Data reviewed: Chart in Epic.   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   Constipation 10/02/2018   HLD (hyperlipidemia) 10/02/2018   Overweight (BMI 25.0-29.9) 10/02/2018   Thyroid disease    Vaginal Pap smear, abnormal    Vitamin D deficiency disease 10/02/2018    Past Surgical History:  Procedure Laterality Date   CHOLECYSTECTOMY     LEEP     MOUTH SURGERY      Social History   Socioeconomic History   Marital status: Married    Spouse name: Not on file   Number of children: Not on file   Years of education: Not on file   Highest education level: Not on file  Occupational History   Not on file  Tobacco Use   Smoking status: Never   Smokeless tobacco: Never  Vaping Use   Vaping Use: Never used  Substance and Sexual Activity   Alcohol use: No   Drug use: No   Sexual activity: Not Currently    Birth control/protection: None  Other Topics Concern   Not on file  Social History Narrative   Works as a Surveyor, 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 Married for x 1 year. No children.   Social Determinants of Health   Financial Resource Strain: Not on file  Food Insecurity: Not on file  Transportation Needs: Not on file  Physical Activity: Not on file  Stress: Not on file  Social Connections: Not on file  Intimate Partner Violence: Not on file    Outpatient Encounter Medications as of 05/10/2021  Medication Sig   Cholecalciferol (VITAMIN D-3) 125 MCG (5000 UT) TABS Take 1 tablet by mouth daily.   FLUoxetine (PROZAC) 20 MG capsule  Take 1 capsule (20 mg total) by mouth daily.   fluticasone (FLONASE) 50 MCG/ACT nasal spray Place 2 sprays into both nostrils daily.   levothyroxine (SYNTHROID) 125 MCG tablet Take 1 tablet (125 mcg total) by mouth daily.   Red Yeast Rice Extract (RED YEAST RICE PO) Take by mouth.   No facility-administered encounter medications on file as of 05/10/2021.    No Known Allergies  Review of Systems  Constitutional:  Positive for fatigue. Negative for activity change, appetite change, chills, diaphoresis, fever and unexpected weight change.  HENT:  Positive for congestion, postnasal drip and rhinorrhea.   Eyes: Negative.  Negative for photophobia, pain, discharge, redness, itching and visual disturbance.  Respiratory:  Negative for cough, chest tightness and shortness of breath.   Cardiovascular:  Negative for chest pain, palpitations and leg swelling.  Gastrointestinal:  Negative for abdominal pain, blood in stool, constipation, diarrhea, nausea and vomiting.  Endocrine: Negative.   Genitourinary:  Negative for decreased urine volume, difficulty urinating, dysuria, frequency and urgency.  Musculoskeletal:  Negative for arthralgias and myalgias.  Skin: Negative.   Allergic/Immunologic: Negative.   Neurological:  Positive for headaches. Negative for dizziness, tremors, seizures, syncope, facial asymmetry, speech difficulty, weakness, light-headedness and numbness.  Hematological: Negative.   Psychiatric/Behavioral:  Positive for decreased concentration. Negative for agitation, behavioral problems, confusion, dysphoric mood, hallucinations, self-injury, sleep disturbance and suicidal ideas. The patient is not nervous/anxious and is not hyperactive.   All other systems reviewed and are negative.      Objective:  BP 110/72   Pulse 62   Temp 98.3 F (36.8 C)   Ht 5\' 3"  (1.6 m)   Wt 153 lb (69.4 kg)   LMP 03/11/2021 (Exact Date)   SpO2 95%   BMI 27.10 kg/m    Wt Readings from Last 3  Encounters:  05/10/21 153 lb (69.4 kg)  02/02/21 155 lb (70.3 kg)  01/19/21 154 lb (69.9 kg)    Physical Exam Vitals and nursing note reviewed.  Constitutional:      General: She is not in acute distress.    Appearance: Normal appearance. She is well-developed and well-groomed. She is not ill-appearing, toxic-appearing or diaphoretic.  HENT:     Head: Normocephalic and atraumatic.     Jaw: There is normal jaw occlusion.     Right Ear: Hearing, ear canal and external ear normal. A middle ear effusion is present. Tympanic  membrane is not erythematous.     Left Ear: Hearing, ear canal and external ear normal. A middle ear effusion is present. Tympanic membrane is not erythematous.     Nose: Nose normal.     Mouth/Throat:     Lips: Pink.     Mouth: Mucous membranes are moist.     Pharynx: Oropharynx is clear. Uvula midline.     Comments: Posterior pharynx cobblestoning Eyes:     General: Lids are normal.     Extraocular Movements: Extraocular movements intact.     Conjunctiva/sclera: Conjunctivae normal.     Pupils: Pupils are equal, round, and reactive to light.  Neck:     Thyroid: No thyroid mass, thyromegaly or thyroid tenderness.     Vascular: No carotid bruit or JVD.     Trachea: Trachea and phonation normal.  Cardiovascular:     Rate and Rhythm: Normal rate and regular rhythm.     Chest Wall: PMI is not displaced.     Pulses: Normal pulses.     Heart sounds: Normal heart sounds. No murmur heard.   No friction rub. No gallop.  Pulmonary:     Effort: Pulmonary effort is normal. No respiratory distress.     Breath sounds: Normal breath sounds. No wheezing.  Abdominal:     General: Bowel sounds are normal. There is no distension or abdominal bruit.     Palpations: Abdomen is soft. There is no hepatomegaly or splenomegaly.     Tenderness: There is no abdominal tenderness. There is no right CVA tenderness or left CVA tenderness.     Hernia: No hernia is present.   Musculoskeletal:        General: Normal range of motion.     Cervical back: Normal range of motion and neck supple.     Right lower leg: No edema.     Left lower leg: No edema.  Lymphadenopathy:     Cervical: No cervical adenopathy.  Skin:    General: Skin is warm and dry.     Capillary Refill: Capillary refill takes less than 2 seconds.     Coloration: Skin is not cyanotic, jaundiced or pale.     Findings: No rash.  Neurological:     General: No focal deficit present.     Mental Status: She is alert and oriented to person, place, and time.     Sensory: Sensation is intact.     Motor: Motor function is intact.     Coordination: Coordination is intact.     Gait: Gait is intact.     Deep Tendon Reflexes: Reflexes are normal and symmetric.  Psychiatric:        Attention and Perception: Attention and perception normal.        Mood and Affect: Mood and affect normal.        Speech: Speech normal.        Behavior: Behavior normal. Behavior is cooperative.        Thought Content: Thought content normal.        Cognition and Memory: Cognition and memory normal.        Judgment: Judgment normal.    Results for orders placed or performed in visit on 01/19/21  Thyroid Panel With TSH  Result Value Ref Range   TSH 0.013 (L) 0.450 - 4.500 uIU/mL   T4, Total 11.5 4.5 - 12.0 ug/dL   T3 Uptake Ratio 32 24 - 39 %   Free Thyroxine Index 3.7 1.2 - 4.9  Pertinent labs & imaging results that were available during my care of the patient were reviewed by me and considered in my medical decision making.  Assessment & Plan:  Tylea was seen today for medical management of chronic issues.  Diagnoses and all orders for this visit:  Depression, major, single episode, moderate (HCC) GAD (generalized anxiety disorder) Doing very well on current regimen, will continue.   Acquired hypothyroidism Last TSH low, will recheck today and adjust regimen if warranted.  -     Thyroid Panel With  TSH  Seasonal allergic rhinitis Symptomatic care discussed in detail. Has not been using flonase daily, advised to start.    Continue all other maintenance medications.  Follow up plan: Return in about 3 months (around 08/10/2021) for physical with PAP.   Continue healthy lifestyle choices, including diet (rich in fruits, vegetables, and lean proteins, and low in salt and simple carbohydrates) and exercise (at least 30 minutes of moderate physical activity daily).  Educational handout given for allergic rhinitis  The above assessment and management plan was discussed with the patient. The patient verbalized understanding of and has agreed to the management plan. Patient is aware to call the clinic if they develop any new symptoms or if symptoms persist or worsen. Patient is aware when to return to the clinic for a follow-up visit. Patient educated on when it is appropriate to go to the emergency department.   Kari Baars, FNP-C [facility] [phone]

## 2021-05-11 ENCOUNTER — Encounter: Payer: BC Managed Care – PPO | Admitting: Family Medicine

## 2021-05-11 LAB — THYROID PANEL WITH TSH
Free Thyroxine Index: 2.6 (ref 1.2–4.9)
T3 Uptake Ratio: 28 % (ref 24–39)
T4, Total: 9.2 ug/dL (ref 4.5–12.0)
TSH: 0.022 u[IU]/mL — ABNORMAL LOW (ref 0.450–4.500)

## 2021-05-11 MED ORDER — LEVOTHYROXINE SODIUM 112 MCG PO TABS: 112.0000 ug | ORAL_TABLET | Freq: Every day | ORAL | 3 refills | Status: DC

## 2021-05-11 NOTE — Addendum Note (Signed)
Addended by: Baruch Gouty on: 05/11/2021 08:06 AM ? ? Modules accepted: Orders ? ?

## 2021-05-12 ENCOUNTER — Other Ambulatory Visit: Payer: Self-pay | Admitting: Family Medicine

## 2021-05-12 DIAGNOSIS — J0140 Acute pansinusitis, unspecified: Secondary | ICD-10-CM

## 2021-05-12 DIAGNOSIS — J069 Acute upper respiratory infection, unspecified: Secondary | ICD-10-CM

## 2021-08-10 ENCOUNTER — Encounter: Payer: BC Managed Care – PPO | Admitting: Family Medicine

## 2021-08-16 ENCOUNTER — Encounter: Payer: BC Managed Care – PPO | Admitting: Family Medicine

## 2021-08-19 ENCOUNTER — Ambulatory Visit (INDEPENDENT_AMBULATORY_CARE_PROVIDER_SITE_OTHER): Payer: BC Managed Care – PPO | Admitting: Family Medicine

## 2021-08-19 ENCOUNTER — Encounter: Payer: Self-pay | Admitting: Family Medicine

## 2021-08-19 ENCOUNTER — Other Ambulatory Visit (HOSPITAL_COMMUNITY)
Admission: RE | Admit: 2021-08-19 | Discharge: 2021-08-19 | Disposition: A | Discharge status: Home / Self Care | Payer: BC Managed Care – PPO | Source: Ambulatory Visit | Attending: Family Medicine | Admitting: Family Medicine

## 2021-08-19 VITALS — BP 108/72 | HR 65 | Temp 98.8°F | Ht 63.0 in | Wt 160.0 lb

## 2021-08-19 DIAGNOSIS — Z01411 Encounter for gynecological examination (general) (routine) with abnormal findings: Secondary | ICD-10-CM

## 2021-08-19 DIAGNOSIS — Z01419 Encounter for gynecological examination (general) (routine) without abnormal findings: Secondary | ICD-10-CM | POA: Diagnosis present

## 2021-08-19 DIAGNOSIS — E039 Hypothyroidism, unspecified: Secondary | ICD-10-CM

## 2021-08-19 DIAGNOSIS — Z124 Encounter for screening for malignant neoplasm of cervix: Secondary | ICD-10-CM

## 2021-08-19 DIAGNOSIS — Z0001 Encounter for general adult medical examination with abnormal findings: Secondary | ICD-10-CM

## 2021-08-19 DIAGNOSIS — Z1231 Encounter for screening mammogram for malignant neoplasm of breast: Secondary | ICD-10-CM | POA: Diagnosis not present

## 2021-08-19 DIAGNOSIS — E559 Vitamin D deficiency, unspecified: Secondary | ICD-10-CM

## 2021-08-19 DIAGNOSIS — Z0000 Encounter for general adult medical examination without abnormal findings: Secondary | ICD-10-CM

## 2021-08-19 NOTE — Progress Notes (Signed)
Kimberly Brennan is a 51 y.o. female presents to office today for annual physical exam examination.    Concerns today include: 1. none  Occupation: bus driver Marital status: married Substance use: denies Diet: generally healthy Exercise: not regular, active daily Last eye exam: yearly Last dental exam: yearly Last colonoscopy: cologuard 2022 - negative Last mammogram: over a year ago, ordered today Last pap smear: completed today Refills needed today: none Immunizations needed:  Flu Vaccine: will get later in season  Tdap Vaccine: 2018   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   Constipation 10/02/2018   HLD (hyperlipidemia) 10/02/2018   Overweight (BMI 25.0-29.9) 10/02/2018   Thyroid disease    Vaginal Pap smear, abnormal    Vitamin D deficiency disease 10/02/2018   Social History   Socioeconomic History   Marital status: Married    Spouse name: Not on file   Number of children: Not on file   Years of education: Not on file   Highest education level: Not on file  Occupational History   Not on file  Tobacco Use   Smoking status: Never   Smokeless tobacco: Never  Vaping Use   Vaping Use: Never used  Substance and Sexual Activity   Alcohol use: No   Drug use: No   Sexual activity: Not Currently    Birth control/protection: None  Other Topics Concern   Not on file  Social History Narrative   Works as a Teacher, 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 Married for x 1 year. No children.   Social Determinants of Health   Financial Resource Strain: Not on file  Food Insecurity: Not on file  Transportation Needs: Not on file  Physical Activity: Not on file  Stress: Not on file  Social Connections: Not on file  Intimate Partner Violence: Not on file   Past Surgical History:  Procedure Laterality Date   CHOLECYSTECTOMY     LEEP     MOUTH SURGERY      Family History  Adopted: Yes    Current Outpatient Medications:    Cholecalciferol (VITAMIN D-3) 125 MCG (5000 UT) TABS, Take 1 tablet by mouth daily., Disp: , Rfl:    FLUoxetine (PROZAC) 20 MG capsule, Take 1 capsule (20 mg total) by mouth daily., Disp: 90 capsule, Rfl: 3   fluticasone (FLONASE) 50 MCG/ACT nasal spray, SPRAY 2 SPRAYS INTO EACH NOSTRIL EVERY DAY, Disp: 48 mL, Rfl: 1   levothyroxine (SYNTHROID) 112 MCG tablet, Take 1 tablet (112 mcg total) by mouth daily., Disp: 90 tablet, Rfl: 3   Red Yeast Rice Extract (RED YEAST RICE PO), Take by mouth., Disp: , Rfl:   No Known Allergies   Review of Systems  Constitutional:  Positive for malaise/fatigue. Negative for chills, diaphoresis, fever and weight loss.  Cardiovascular:  Negative for chest pain, palpitations, orthopnea, claudication, leg swelling and PND.  Gastrointestinal:  Negative for constipation, diarrhea, nausea and vomiting.  Genitourinary:  Negative for dysuria, flank pain, frequency, hematuria and urgency.  Neurological:  Negative for dizziness, tingling, tremors, sensory change, speech change, focal weakness, seizures, loss of consciousness, weakness and headaches.  Psychiatric/Behavioral:  Negative for depression, hallucinations, memory loss, substance abuse and suicidal ideas. The patient is nervous/anxious. The patient does not have insomnia.   All other systems reviewed and are negative.     08/19/2021    9:56 AM 05/10/2021   10:07 AM  02/02/2021    8:36 AM 01/19/2021    8:46 AM 10/19/2020    9:19 AM  Depression screen PHQ 2/9  Decreased Interest 0 0 1 2 1   Down, Depressed, Hopeless 0 0 1 2 1   PHQ - 2 Score 0 0 2 4 2   Altered sleeping 0 0 0 1 1  Tired, decreased energy 1 1 0 1 1  Change in appetite 0 0 0 1 0  Feeling bad or failure about yourself  1 0 0 3 1  Trouble concentrating 0 1 0 1 0  Moving slowly or fidgety/restless 0 0 0 2 1  Suicidal thoughts 0 0 0 0 0  PHQ-9 Score 2 2 2 13 6   Difficult doing  work/chores Not difficult at all Not difficult at all Not difficult at all Somewhat difficult Not difficult at all      08/19/2021    9:56 AM 05/10/2021   10:08 AM 02/02/2021    8:37 AM 01/19/2021    8:45 AM  GAD 7 : Generalized Anxiety Score  Nervous, Anxious, on Edge 1 0 1 3  Control/stop worrying 0 0 0 2  Worry too much - different things 1 0 0 2  Trouble relaxing 1 0 1 1  Restless 0 0 0 1  Easily annoyed or irritable 0 0 1 3  Afraid - awful might happen 1 0 0 3  Total GAD 7 Score 4 0 3 15  Anxiety Difficulty Not difficult at all Not difficult at all Not difficult at all     Physical Exam Vitals and nursing note reviewed.  Constitutional:      General: She is not in acute distress.    Appearance: Normal appearance. She is well-developed, well-groomed and overweight. She is not ill-appearing, toxic-appearing or diaphoretic.  HENT:     Head: Normocephalic and atraumatic.     Jaw: There is normal jaw occlusion.     Salivary Glands: Right salivary gland is not diffusely enlarged or tender. Left salivary gland is not diffusely enlarged or tender.     Right Ear: Hearing, tympanic membrane, ear canal and external ear normal.     Left Ear: Hearing, tympanic membrane, ear canal and external ear normal.     Nose: Nose normal.     Mouth/Throat:     Lips: Pink.     Mouth: Mucous membranes are moist.     Pharynx: Oropharynx is clear. Uvula midline.  Eyes:     General: Lids are normal. Vision grossly intact. Gaze aligned appropriately.     Extraocular Movements: Extraocular movements intact.     Conjunctiva/sclera: Conjunctivae normal.     Pupils: Pupils are equal, round, and reactive to light.  Neck:     Thyroid: No thyroid mass, thyromegaly or thyroid tenderness.     Trachea: Trachea and phonation normal.  Cardiovascular:     Rate and Rhythm: Normal rate and regular rhythm.     Pulses: Normal pulses.     Heart sounds: Normal heart sounds.  Pulmonary:     Effort: Pulmonary effort is  normal.     Breath sounds: Normal breath sounds.  Chest:     Chest wall: No mass, lacerations, deformity, swelling, tenderness, crepitus or edema. There is no dullness to percussion.  Breasts:    Tanner Score is 5.     Breasts are symmetrical.     Right: Normal.     Left: Normal.  Abdominal:     General: Abdomen is flat. Bowel  sounds are normal. There is no distension.     Palpations: Abdomen is soft. There is no shifting dullness, fluid wave, hepatomegaly, splenomegaly, mass or pulsatile mass.     Tenderness: There is no abdominal tenderness. There is no right CVA tenderness, left CVA tenderness, guarding or rebound.     Hernia: No hernia is present. There is no hernia in the left inguinal area or right inguinal area.  Genitourinary:    Exam position: Lithotomy position.     Pubic Area: No rash or pubic lice.      Tanner stage (genital): 5.     Labia:        Right: No rash, tenderness, lesion or injury.        Left: No rash, tenderness, lesion or injury.      Urethra: No prolapse, urethral pain, urethral swelling or urethral lesion.     Vagina: Normal.     Cervix: Normal.     Uterus: Normal.      Adnexa: Right adnexa normal and left adnexa normal.     Rectum: Normal.  Musculoskeletal:        General: Normal range of motion.     Cervical back: Normal range of motion and neck supple.     Right lower leg: No edema.     Left lower leg: No edema.  Lymphadenopathy:     Upper Body:     Right upper body: No supraclavicular, axillary or pectoral adenopathy.     Left upper body: No supraclavicular, axillary or pectoral adenopathy.     Lower Body: No right inguinal adenopathy. No left inguinal adenopathy.  Skin:    General: Skin is warm and dry.     Capillary Refill: Capillary refill takes less than 2 seconds.     Findings: No rash.  Neurological:     General: No focal deficit present.     Mental Status: She is alert and oriented to person, place, and time.  Psychiatric:         Mood and Affect: Mood normal.        Behavior: Behavior normal. Behavior is cooperative.        Thought Content: Thought content normal.        Judgment: Judgment normal.      Assessment/ Plan: Kimberly Brennan was seen today for annual exam and gynecologic exam.  Diagnoses and all orders for this visit:  Annual physical exam Health maintenance discussed in detail and updated today. Diet and exercise encouraged. Mammogram ordered, PAP completed. Labs pending.  -     CBC with Differential/Platelet -     CMP14+EGFR -     Lipid panel -     Thyroid Panel With TSH -     VITAMIN D 25 Hydroxy (Vit-D Deficiency, Fractures) -     Cytology - PAP -     MM 3D SCREEN BREAST BILATERAL  Encounter for gynecological examination -     Cytology - PAP  Screening for cervical cancer -     Cytology - PAP  Encounter for screening mammogram for malignant neoplasm of breast -     MM 3D SCREEN BREAST BILATERAL  Vitamin D deficiency Will check labs and adjust repletion therapy if warranted.  -     VITAMIN D 25 Hydroxy (Vit-D Deficiency, Fractures)  Acquired hypothyroidism Thyroid disease has been fairly controlled. Labs are pending. Adjustments to regimen will be made if warranted. Make sure to take medications on an empty stomach with a full glass  of water. Make sure to avoid vitamins or supplements for at least 4 hours before and 4 hours after taking medications. Repeat labs in 3 months if adjustments are made and in 6 months if stable.    -     Thyroid Panel With TSH    Counseled on healthy lifestyle choices, including diet (rich in fruits, vegetables and lean meats and low in salt and simple carbohydrates) and exercise (at least 30 minutes of moderate physical activity daily).  Patient to follow up in 1 year for annual exam or sooner if needed.  The above assessment and management plan was discussed with the patient. The patient verbalized understanding of and has agreed to the management plan. Patient  is aware to call the clinic if symptoms persist or worsen. Patient is aware when to return to the clinic for a follow-up visit. Patient educated on when it is appropriate to go to the emergency department.   Monia Pouch, FNP-C [facility] [address] [place], [place] [address] [phone]

## 2021-08-20 LAB — CMP14+EGFR
ALT: 14 IU/L (ref 0–32)
AST: 14 IU/L (ref 0–40)
Albumin/Globulin Ratio: 1.9 (ref 1.2–2.2)
Albumin: 4.5 g/dL (ref 3.9–4.9)
Alkaline Phosphatase: 60 IU/L (ref 44–121)
BUN/Creatinine Ratio: 15 (ref 9–23)
BUN: 11 mg/dL (ref 6–24)
Bilirubin Total: 0.4 mg/dL (ref 0.0–1.2)
CO2: 20 mmol/L (ref 20–29)
Calcium: 9.5 mg/dL (ref 8.7–10.2)
Chloride: 102 mmol/L (ref 96–106)
Creatinine, Ser: 0.75 mg/dL (ref 0.57–1.00)
Globulin, Total: 2.4 g/dL (ref 1.5–4.5)
Glucose: 93 mg/dL (ref 70–99)
Potassium: 4.3 mmol/L (ref 3.5–5.2)
Sodium: 139 mmol/L (ref 134–144)
Total Protein: 6.9 g/dL (ref 6.0–8.5)
eGFR: 98 mL/min/{1.73_m2} (ref >59)

## 2021-08-20 LAB — CBC WITH DIFFERENTIAL/PLATELET
Basophils Absolute: 0.1 10*3/uL (ref 0.0–0.2)
Basos: 1 %
EOS (ABSOLUTE): 0 10*3/uL (ref 0.0–0.4)
Eos: 1 %
Hematocrit: 41.6 % (ref 34.0–46.6)
Hemoglobin: 14.2 g/dL (ref 11.1–15.9)
Immature Grans (Abs): 0 10*3/uL (ref 0.0–0.1)
Immature Granulocytes: 0 %
Lymphocytes Absolute: 1.7 10*3/uL (ref 0.7–3.1)
Lymphs: 37 %
MCH: 30.6 pg (ref 26.6–33.0)
MCHC: 34.1 g/dL (ref 31.5–35.7)
MCV: 90 fL (ref 79–97)
Monocytes Absolute: 0.4 10*3/uL (ref 0.1–0.9)
Monocytes: 8 %
Neutrophils Absolute: 2.4 10*3/uL (ref 1.4–7.0)
Neutrophils: 53 %
Platelets: 185 10*3/uL (ref 150–450)
RBC: 4.64 x10E6/uL (ref 3.77–5.28)
RDW: 12.7 % (ref 11.7–15.4)
WBC: 4.6 10*3/uL (ref 3.4–10.8)

## 2021-08-20 LAB — LIPID PANEL
Chol/HDL Ratio: 3.6 ratio (ref 0.0–4.4)
Cholesterol, Total: 239 mg/dL — ABNORMAL HIGH (ref 100–199)
HDL: 66 mg/dL (ref >39)
LDL Chol Calc (NIH): 162 mg/dL — ABNORMAL HIGH (ref 0–99)
Triglycerides: 68 mg/dL (ref 0–149)
VLDL Cholesterol Cal: 11 mg/dL (ref 5–40)

## 2021-08-20 LAB — THYROID PANEL WITH TSH
Free Thyroxine Index: 2.9 (ref 1.2–4.9)
T3 Uptake Ratio: 28 % (ref 24–39)
T4, Total: 10.3 ug/dL (ref 4.5–12.0)
TSH: 0.13 u[IU]/mL — ABNORMAL LOW (ref 0.450–4.500)

## 2021-08-20 LAB — VITAMIN D 25 HYDROXY (VIT D DEFICIENCY, FRACTURES): Vit D, 25-Hydroxy: 67.5 ng/mL (ref 30.0–100.0)

## 2021-08-25 LAB — CYTOLOGY - PAP
Chlamydia: NEGATIVE
Comment: NEGATIVE
Comment: NEGATIVE
Comment: NEGATIVE
Comment: NORMAL
Diagnosis: NEGATIVE
High risk HPV: NEGATIVE
Neisseria Gonorrhea: NEGATIVE
Trichomonas: NEGATIVE

## 2021-08-26 ENCOUNTER — Ambulatory Visit
Admission: RE | Admit: 2021-08-26 | Discharge: 2021-08-26 | Disposition: A | Discharge status: Home / Self Care | Payer: BC Managed Care – PPO | Source: Ambulatory Visit | Attending: Family Medicine | Admitting: Family Medicine

## 2021-09-21 ENCOUNTER — Encounter: Payer: Self-pay | Admitting: Family Medicine

## 2021-09-21 ENCOUNTER — Ambulatory Visit: Payer: BC Managed Care – PPO | Admitting: Family Medicine

## 2021-09-21 VITALS — BP 136/82 | HR 62 | Temp 98.3°F | Ht 63.0 in | Wt 165.0 lb

## 2021-09-21 DIAGNOSIS — N751 Abscess of Bartholin's gland: Secondary | ICD-10-CM | POA: Diagnosis not present

## 2021-09-21 MED ORDER — DOXYCYCLINE HYCLATE 100 MG PO TABS: 100.0000 mg | ORAL_TABLET | Freq: Two times a day (BID) | ORAL | 0 refills | Status: AC

## 2021-09-21 NOTE — Progress Notes (Signed)
Subjective:  Patient ID: Kimberly Brennan, female    DOB: 30-May-1970, 51 y.o.   MRN: [id number]  Patient Care Team: Baruch Gouty, FNP as PCP - General (Family Medicine)   Chief Complaint:  Cyst (Vaginal/)   HPI: Kimberly Brennan is a 51 y.o. female presenting on 09/21/2021 for Cyst (Vaginal/)   Pt presents today for swelling and drainage on labia. States this started several days ago. States area became very red and tender. Opened and started draining yesterday. No other associated symptoms. Has not tried anything for symptoms.      Relevant past medical, surgical, family, and social history reviewed and updated as indicated.  Allergies and medications reviewed and updated. Data reviewed: Chart in Epic.   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   Constipation 10/02/2018   HLD (hyperlipidemia) 10/02/2018   Overweight (BMI 25.0-29.9) 10/02/2018   Thyroid disease    Vaginal Pap smear, abnormal    Vitamin D deficiency disease 10/02/2018    Past Surgical History:  Procedure Laterality Date   CHOLECYSTECTOMY     LEEP     MOUTH SURGERY      Social History   Socioeconomic History   Marital status: Married    Spouse name: Not on file   Number of children: Not on file   Years of education: Not on file   Highest education level: Not on file  Occupational History   Not on file  Tobacco Use   Smoking status: Never   Smokeless tobacco: Never  Vaping Use   Vaping Use: Never used  Substance and Sexual Activity   Alcohol use: No   Drug use: No   Sexual activity: Not Currently    Birth control/protection: None  Other Topics Concern   Not on file  Social History Narrative   Works as a Teacher, 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 Married for x 1 year. No children.   Social Determinants of Health   Financial Resource Strain: Not on file  Food  Insecurity: Not on file  Transportation Needs: Not on file  Physical Activity: Not on file  Stress: Not on file  Social Connections: Not on file  Intimate Partner Violence: Not on file    Outpatient Encounter Medications as of 09/21/2021  Medication Sig   Cholecalciferol (VITAMIN D-3) 125 MCG (5000 UT) TABS Take 1 tablet by mouth daily.   FLUoxetine (PROZAC) 20 MG capsule Take 1 capsule (20 mg total) by mouth daily.   fluticasone (FLONASE) 50 MCG/ACT nasal spray SPRAY 2 SPRAYS INTO EACH NOSTRIL EVERY DAY   levothyroxine (SYNTHROID) 112 MCG tablet Take 1 tablet (112 mcg total) by mouth daily.   Red Yeast Rice Extract (RED YEAST RICE PO) Take by mouth.   doxycycline (VIBRA-TABS) 100 MG tablet Take 1 tablet (100 mg total) by mouth 2 (two) times daily for 10 days. 1 po bid   No facility-administered encounter medications on file as of 09/21/2021.    No Known Allergies  Review of Systems  Constitutional:  Negative for activity change, appetite change, chills, diaphoresis, fatigue, fever and unexpected weight change.  Respiratory:  Negative for cough and shortness of breath.   Cardiovascular:  Negative for chest pain, palpitations and leg swelling.  Gastrointestinal:  Negative for abdominal pain.  Genitourinary:  Negative for decreased urine volume, difficulty urinating, vaginal bleeding, vaginal discharge and vaginal pain.  Musculoskeletal:  Negative for arthralgias and back pain.  Skin:  Positive for color change and rash. Negative for pallor and wound.  Neurological:  Negative for dizziness, weakness and headaches.  All other systems reviewed and are negative.       Objective:  BP 136/82   Pulse 62   Temp 98.3 F (36.8 C)   Ht 5' 3" (1.6 m)   Wt 165 lb (74.8 kg)   SpO2 98%   BMI 29.23 kg/m    Wt Readings from Last 3 Encounters:  09/21/21 165 lb (74.8 kg)  08/19/21 160 lb (72.6 kg)  05/10/21 153 lb (69.4 kg)    Physical Exam Vitals and nursing note reviewed.   Constitutional:      Appearance: Normal appearance.  HENT:     Head: Normocephalic and atraumatic.     Nose: Nose normal.     Mouth/Throat:     Mouth: Mucous membranes are moist.  Eyes:     Conjunctiva/sclera: Conjunctivae normal.     Pupils: Pupils are equal, round, and reactive to light.  Cardiovascular:     Rate and Rhythm: Normal rate and regular rhythm.  Pulmonary:     Effort: Pulmonary effort is normal.     Breath sounds: Normal breath sounds.  Genitourinary:    Exam position: Lithotomy position.     Labia:        Right: Tenderness and lesion present. No rash or injury.     Skin:    General: Skin is warm and dry.     Capillary Refill: Capillary refill takes less than 2 seconds.  Neurological:     General: No focal deficit present.     Mental Status: She is alert and oriented to person, place, and time.  Psychiatric:        Mood and Affect: Mood normal.        Behavior: Behavior normal.        Thought Content: Thought content normal.        Judgment: Judgment normal.     Results for orders placed or performed in visit on 08/19/21  CBC with Differential/Platelet  Result Value Ref Range   WBC 4.6 3.4 - 10.8 x10E3/uL   RBC 4.64 3.77 - 5.28 x10E6/uL   Hemoglobin 14.2 11.1 - 15.9 g/dL   Hematocrit 41.6 34.0 - 46.6 %   MCV 90 79 - 97 fL   MCH 30.6 26.6 - 33.0 pg   MCHC 34.1 31.5 - 35.7 g/dL   RDW 12.7 11.7 - 15.4 %   Platelets 185 150 - 450 x10E3/uL   Neutrophils 53 Not Estab. %   Lymphs 37 Not Estab. %   Monocytes 8 Not Estab. %   Eos 1 Not Estab. %   Basos 1 Not Estab. %   Neutrophils Absolute 2.4 1.4 - 7.0 x10E3/uL   Lymphocytes Absolute 1.7 0.7 - 3.1 x10E3/uL   Monocytes Absolute 0.4 0.1 - 0.9 x10E3/uL   EOS (ABSOLUTE) 0.0 0.0 - 0.4 x10E3/uL   Basophils Absolute 0.1 0.0 - 0.2 x10E3/uL   Immature Granulocytes 0 Not Estab. %   Immature Grans (Abs) 0.0 0.0 - 0.1 x10E3/uL  CMP14+EGFR  Result Value Ref Range   Glucose 93 70 - 99 mg/dL   BUN 11 6 - 24  mg/dL   Creatinine, Ser 0.75 0.57 - 1.00 mg/dL   eGFR 98 >59 mL/min/1.73   BUN/Creatinine Ratio 15 9 - 23   Sodium 139 134 - 144 mmol/L   Potassium 4.3 3.5 -  5.2 mmol/L   Chloride 102 96 - 106 mmol/L   CO2 20 20 - 29 mmol/L   Calcium 9.5 8.7 - 10.2 mg/dL   Total Protein 6.9 6.0 - 8.5 g/dL   Albumin 4.5 3.9 - 4.9 g/dL   Globulin, Total 2.4 1.5 - 4.5 g/dL   Albumin/Globulin Ratio 1.9 1.2 - 2.2   Bilirubin Total 0.4 0.0 - 1.2 mg/dL   Alkaline Phosphatase 60 44 - 121 IU/L   AST 14 0 - 40 IU/L   ALT 14 0 - 32 IU/L  Lipid panel  Result Value Ref Range   Cholesterol, Total 239 (H) 100 - 199 mg/dL   Triglycerides 68 0 - 149 mg/dL   HDL 66 >39 mg/dL   VLDL Cholesterol Cal 11 5 - 40 mg/dL   LDL Chol Calc (NIH) 162 (H) 0 - 99 mg/dL   Chol/HDL Ratio 3.6 0.0 - 4.4 ratio  Thyroid Panel With TSH  Result Value Ref Range   TSH 0.130 (L) 0.450 - 4.500 uIU/mL   T4, Total 10.3 4.5 - 12.0 ug/dL   T3 Uptake Ratio 28 24 - 39 %   Free Thyroxine Index 2.9 1.2 - 4.9  VITAMIN D 25 Hydroxy (Vit-D Deficiency, Fractures)  Result Value Ref Range   Vit D, 25-Hydroxy 67.5 30.0 - 100.0 ng/mL  Cytology - PAP  Result Value Ref Range   High risk HPV Negative    Neisseria Gonorrhea Negative    Chlamydia Negative    Trichomonas Negative    Adequacy      Satisfactory for evaluation; transformation zone component PRESENT.   Diagnosis      - Negative for intraepithelial lesion or malignancy (NILM)   Comment Normal Reference Range HPV - Negative    Comment Normal Reference Range Trichomonas - Negative    Comment Normal Reference Ranger Chlamydia - Negative    Comment      Normal Reference Range Neisseria Gonorrhea - Negative       Pertinent labs & imaging results that were available during my care of the patient were reviewed by me and considered in my medical decision making.  Assessment & Plan:  Amberley was seen today for cyst.  Diagnoses and all orders for this visit:  Bartholin's gland  abscess Open and draining. No word catheter available in office. Will place on doxycycline. If symptoms reoccur, will refer to GYN. Report new or worsening symptoms.  -     doxycycline (VIBRA-TABS) 100 MG tablet; Take 1 tablet (100 mg total) by mouth 2 (two) times daily for 10 days. 1 po bid     Continue all other maintenance medications.  Follow up plan: Return if symptoms worsen or fail to improve.   Continue healthy lifestyle choices, including diet (rich in fruits, vegetables, and lean proteins, and low in salt and simple carbohydrates) and exercise (at least 30 minutes of moderate physical activity daily).  Educational handout given for bartholin's cyst  The above assessment and management plan was discussed with the patient. The patient verbalized understanding of and has agreed to the management plan. Patient is aware to call the clinic if they develop any new symptoms or if symptoms persist or worsen. Patient is aware when to return to the clinic for a follow-up visit. Patient educated on when it is appropriate to go to the emergency department.   Monia Pouch, FNP-C [facility] [phone]

## 2022-01-04 ENCOUNTER — Other Ambulatory Visit: Payer: Self-pay | Admitting: Family Medicine

## 2022-01-04 DIAGNOSIS — F321 Major depressive disorder, single episode, moderate: Secondary | ICD-10-CM

## 2022-01-04 DIAGNOSIS — F411 Generalized anxiety disorder: Secondary | ICD-10-CM

## 2022-04-02 ENCOUNTER — Other Ambulatory Visit: Payer: Self-pay | Admitting: Family Medicine

## 2022-04-02 DIAGNOSIS — E039 Hypothyroidism, unspecified: Secondary | ICD-10-CM

## 2022-04-21 ENCOUNTER — Other Ambulatory Visit: Payer: Self-pay | Admitting: Family Medicine

## 2022-04-21 ENCOUNTER — Encounter: Payer: Self-pay | Admitting: Family Medicine

## 2022-04-21 DIAGNOSIS — E039 Hypothyroidism, unspecified: Secondary | ICD-10-CM

## 2022-04-21 MED ORDER — LEVOTHYROXINE SODIUM 112 MCG PO TABS: 112.0000 ug | ORAL_TABLET | Freq: Every day | ORAL | 1 refills | Status: DC

## 2022-08-22 ENCOUNTER — Encounter: Payer: BC Managed Care – PPO | Admitting: Family Medicine

## 2022-08-29 ENCOUNTER — Ambulatory Visit (INDEPENDENT_AMBULATORY_CARE_PROVIDER_SITE_OTHER): Payer: BC Managed Care – PPO | Admitting: Family Medicine

## 2022-08-29 ENCOUNTER — Encounter: Payer: Self-pay | Admitting: Family Medicine

## 2022-08-29 VITALS — BP 148/81 | HR 72 | Temp 97.5°F | Ht 63.0 in | Wt 174.6 lb

## 2022-08-29 DIAGNOSIS — E559 Vitamin D deficiency, unspecified: Secondary | ICD-10-CM | POA: Diagnosis not present

## 2022-08-29 DIAGNOSIS — E039 Hypothyroidism, unspecified: Secondary | ICD-10-CM

## 2022-08-29 DIAGNOSIS — Z1231 Encounter for screening mammogram for malignant neoplasm of breast: Secondary | ICD-10-CM

## 2022-08-29 DIAGNOSIS — Z0001 Encounter for general adult medical examination with abnormal findings: Secondary | ICD-10-CM | POA: Diagnosis not present

## 2022-08-29 DIAGNOSIS — Z0000 Encounter for general adult medical examination without abnormal findings: Secondary | ICD-10-CM

## 2022-08-29 MED ORDER — LEVOTHYROXINE SODIUM 112 MCG PO TABS: 112.0000 ug | ORAL_TABLET | Freq: Every day | ORAL | 1 refills | Status: DC

## 2022-08-29 NOTE — Progress Notes (Signed)
Complete physical exam  Patient: Kimberly Brennan   DOB: [birth year]   52 y.o. Female  MRN: [id number]  Subjective:    Chief Complaint  Patient presents with   Annual Exam    Kimberly Brennan is a 52 y.o. female who presents today for a complete physical exam. She reports consuming a general diet. The patient does not participate in regular exercise at present. She generally feels well. She reports sleeping well. She does not have additional problems to discuss today.    Most recent fall risk assessment:    08/29/2022    8:32 AM  Fall Risk   Falls in the past year? 0     Most recent depression screenings:    08/29/2022    8:32 AM 09/21/2021    8:48 AM  PHQ 2/9 Scores  PHQ - 2 Score 0 0  PHQ- 9 Score 5 3    Vision:Within last year and Dental: No current dental problems and Receives regular dental care  Patient Active Problem List   Diagnosis Date Noted   Seasonal allergic rhinitis due to pollen 05/10/2021   Depression, major, single episode, moderate (HCC) 02/02/2021   GAD (generalized anxiety disorder) 02/02/2021   Hypothyroidism 10/12/2020   HLD (hyperlipidemia) 10/02/2018   Vitamin D deficiency 10/02/2018   Constipation 10/02/2018   Overweight (BMI 25.0-29.9) 10/02/2018   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   Constipation 10/02/2018   HLD (hyperlipidemia) 10/02/2018   Overweight (BMI 25.0-29.9) 10/02/2018   Thyroid disease    Vaginal Pap smear, abnormal    Vitamin D deficiency disease 10/02/2018   Past Surgical History:  Procedure Laterality Date   CHOLECYSTECTOMY     LEEP     MOUTH SURGERY     Social History   Tobacco Use   Smoking status: Never   Smokeless tobacco: Never  Vaping Use   Vaping status: Never Used  Substance Use Topics   Alcohol use: No   Drug use: No   Social History   Socioeconomic History   Marital status: Married    Spouse name: Not on file   Number of children: Not on file   Years of  education: Not on file   Highest education level: Not on file  Occupational History   Not on file  Tobacco Use   Smoking status: Never   Smokeless tobacco: Never  Vaping Use   Vaping status: Never Used  Substance and Sexual Activity   Alcohol use: No   Drug use: No   Sexual activity: Not Currently    Birth control/protection: None  Other Topics Concern   Not on file  Social History Narrative   Works as a Surveyor, 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 Married for x 1 year. No children.   Social Determinants of Health   Financial Resource Strain: Not on file  Food Insecurity: Not on file  Transportation Needs: Not on file  Physical Activity: Not on file  Stress: Not on file  Social Connections: Not on file  Intimate Partner Violence: Not on file   Family Status  Relation Name Status   Mother  Alive   Father  Deceased   Sister  Alive   Neg Hx  (Not Specified)  No partnership data on file   Family History  Adopted: Yes  Problem Relation Age of Onset   Breast cancer Neg Hx  No Known Allergies    Patient Care Team: Sonny Masters, FNP as PCP - General (Family Medicine)   Outpatient Medications Prior to Visit  Medication Sig   Cholecalciferol (VITAMIN D-3) 125 MCG (5000 UT) TABS Take 1 tablet by mouth daily.   fluticasone (FLONASE) 50 MCG/ACT nasal spray SPRAY 2 SPRAYS INTO EACH NOSTRIL EVERY DAY   Red Yeast Rice Extract (RED YEAST RICE PO) Take by mouth.   [DISCONTINUED] levothyroxine (SYNTHROID) 112 MCG tablet Take 1 tablet (112 mcg total) by mouth daily. (NEEDS TO BE SEEN BEFORE NEXT REFILL)   [DISCONTINUED] FLUoxetine (PROZAC) 20 MG capsule TAKE 1 CAPSULE BY MOUTH EVERY DAY   No facility-administered medications prior to visit.    Review of Systems  Psychiatric/Behavioral:  The patient is nervous/anxious and has insomnia (adjusting to starting back to school hours).   All  other systems reviewed and are negative.         Objective:     BP (!) 148/81   Pulse 72   Temp (!) 97.5 F (36.4 C) (Temporal)   Ht 5\' 3"  (1.6 m)   Wt 174 lb 9.6 oz (79.2 kg)   SpO2 99%   BMI 30.93 kg/m  BP Readings from Last 3 Encounters:  08/29/22 (!) 148/81  09/21/21 136/82  08/19/21 108/72   Wt Readings from Last 3 Encounters:  08/29/22 174 lb 9.6 oz (79.2 kg)  09/21/21 165 lb (74.8 kg)  08/19/21 160 lb (72.6 kg)   SpO2 Readings from Last 3 Encounters:  08/29/22 99%  09/21/21 98%  08/19/21 95%      Physical Exam Vitals and nursing note reviewed.  Constitutional:      General: She is not in acute distress.    Appearance: Normal appearance. She is well-developed and well-groomed. She is obese. She is not ill-appearing, toxic-appearing or diaphoretic.  HENT:     Head: Normocephalic and atraumatic.     Jaw: There is normal jaw occlusion.     Right Ear: Hearing, tympanic membrane, ear canal and external ear normal.     Left Ear: Hearing, tympanic membrane, ear canal and external ear normal.     Nose: Nose normal.     Mouth/Throat:     Lips: Pink.     Mouth: Mucous membranes are moist.     Pharynx: Oropharynx is clear. Uvula midline.  Eyes:     General: Lids are normal.     Extraocular Movements: Extraocular movements intact.     Conjunctiva/sclera: Conjunctivae normal.     Pupils: Pupils are equal, round, and reactive to light.  Neck:     Thyroid: No thyroid mass, thyromegaly or thyroid tenderness.     Vascular: No carotid bruit or JVD.     Trachea: Trachea and phonation normal.  Cardiovascular:     Rate and Rhythm: Normal rate and regular rhythm.     Chest Wall: PMI is not displaced.     Pulses: Normal pulses.     Heart sounds: Normal heart sounds. No murmur heard.    No friction rub. No gallop.  Pulmonary:     Effort: Pulmonary effort is normal. No respiratory distress.     Breath sounds: Normal breath sounds. No wheezing.  Abdominal:      General: Bowel sounds are normal. There is no distension or abdominal bruit.     Palpations: Abdomen is soft. There is no hepatomegaly or splenomegaly.     Tenderness: There is no abdominal tenderness. There is no right CVA  tenderness or left CVA tenderness.     Hernia: No hernia is present.  Genitourinary:    Comments: Deferred Musculoskeletal:        General: Normal range of motion.     Cervical back: Normal range of motion and neck supple.     Right lower leg: No edema.     Left lower leg: No edema.  Lymphadenopathy:     Cervical: No cervical adenopathy.  Skin:    General: Skin is warm and dry.     Capillary Refill: Capillary refill takes less than 2 seconds.     Coloration: Skin is not cyanotic, jaundiced or pale.     Findings: No rash.  Neurological:     General: No focal deficit present.     Mental Status: She is alert and oriented to person, place, and time.     Sensory: Sensation is intact.     Motor: Motor function is intact.     Coordination: Coordination is intact.     Gait: Gait is intact.     Deep Tendon Reflexes: Reflexes are normal and symmetric.  Psychiatric:        Attention and Perception: Attention and perception normal.        Mood and Affect: Mood and affect normal.        Speech: Speech normal.        Behavior: Behavior normal. Behavior is cooperative.        Thought Content: Thought content normal.        Cognition and Memory: Cognition and memory normal.        Judgment: Judgment normal.      Last CBC Lab Results  Component Value Date   WBC 4.6 08/19/2021   HGB 14.2 08/19/2021   HCT 41.6 08/19/2021   MCV 90 08/19/2021   MCH 30.6 08/19/2021   RDW 12.7 08/19/2021   PLT 185 08/19/2021   Last metabolic panel Lab Results  Component Value Date   GLUCOSE 93 08/19/2021   NA 139 08/19/2021   K 4.3 08/19/2021   CL 102 08/19/2021   CO2 20 08/19/2021   BUN 11 08/19/2021   CREATININE 0.75 08/19/2021   EGFR 98 08/19/2021   CALCIUM 9.5 08/19/2021    PROT 6.9 08/19/2021   ALBUMIN 4.5 08/19/2021   LABGLOB 2.4 08/19/2021   AGRATIO 1.9 08/19/2021   BILITOT 0.4 08/19/2021   ALKPHOS 60 08/19/2021   AST 14 08/19/2021   ALT 14 08/19/2021   Last lipids Lab Results  Component Value Date   CHOL 239 (H) 08/19/2021   HDL 66 08/19/2021   LDLCALC 162 (H) 08/19/2021   TRIG 68 08/19/2021   CHOLHDL 3.6 08/19/2021   Last hemoglobin A1c Lab Results  Component Value Date   HGBA1C 5.4 01/21/2020   Last thyroid functions Lab Results  Component Value Date   TSH 0.130 (L) 08/19/2021   T4TOTAL 10.3 08/19/2021   Last vitamin D Lab Results  Component Value Date   VD25OH 67.5 08/19/2021        Assessment & Plan:    Routine Health Maintenance and Physical Exam  Immunization History  Administered Date(s) Administered   PFIZER(Purple Top)SARS-COV-2 Vaccination 03/09/2019, 03/30/2019   Tdap 09/21/2016    Health Maintenance  Topic Date Due   Zoster Vaccines- Shingrix (1 of 2) 11/29/2022 (Originally 01/07/2022)   INFLUENZA VACCINE  04/02/2023 (Originally 08/03/2022)   MAMMOGRAM  08/27/2023   Fecal DNA (Cologuard)  10/27/2023   PAP SMEAR-Modifier  08/19/2024  DTaP/Tdap/Td (2 - Td or Tdap) 09/22/2026   Hepatitis C Screening  Completed   HIV Screening  Completed   HPV VACCINES  Aged Out   COVID-19 Vaccine  Discontinued    Discussed health benefits of physical activity, and encouraged her to engage in regular exercise appropriate for her age and condition.  Problem List Items Addressed This Visit       Endocrine   Hypothyroidism   Relevant Medications   levothyroxine (SYNTHROID) 112 MCG tablet   Other Relevant Orders   Thyroid Panel With TSH     Other   Vitamin D deficiency   Relevant Orders   VITAMIN D 25 Hydroxy (Vit-D Deficiency, Fractures)   Other Visit Diagnoses     Annual physical exam    -  Primary   Relevant Orders   CBC with Differential/Platelet   CMP14+EGFR   Lipid panel   Thyroid Panel With TSH   VITAMIN D  25 Hydroxy (Vit-D Deficiency, Fractures)   MM 3D SCREENING MAMMOGRAM BILATERAL BREAST   Encounter for screening mammogram for malignant neoplasm of breast       Relevant Orders   MM 3D SCREENING MAMMOGRAM BILATERAL BREAST      Return in about 1 year (around 08/29/2023) for CPE.     The above assessment and management plan was discussed with the patient. The patient verbalized understanding of and has agreed to the management plan. Patient is aware to call the clinic if they develop any new symptoms or if symptoms fail to improve or worsen. Patient is aware when to return to the clinic for a follow-up visit. Patient educated on when it is appropriate to go to the emergency department.   Kari Baars, FNP-C [facility] [address] [place], [place] [address] [phone]

## 2022-08-30 LAB — CBC WITH DIFFERENTIAL/PLATELET
Basophils Absolute: 0 10*3/uL (ref 0.0–0.2)
Basos: 1 %
EOS (ABSOLUTE): 0.1 10*3/uL (ref 0.0–0.4)
Eos: 1 %
Hematocrit: 43.2 % (ref 34.0–46.6)
Hemoglobin: 13.8 g/dL (ref 11.1–15.9)
Immature Grans (Abs): 0 10*3/uL (ref 0.0–0.1)
Immature Granulocytes: 0 %
Lymphocytes Absolute: 1.7 10*3/uL (ref 0.7–3.1)
Lymphs: 35 %
MCH: 30 pg (ref 26.6–33.0)
MCHC: 31.9 g/dL (ref 31.5–35.7)
MCV: 94 fL (ref 79–97)
Monocytes Absolute: 0.4 10*3/uL (ref 0.1–0.9)
Monocytes: 8 %
Neutrophils Absolute: 2.7 10*3/uL (ref 1.4–7.0)
Neutrophils: 55 %
Platelets: 188 10*3/uL (ref 150–450)
RBC: 4.6 x10E6/uL (ref 3.77–5.28)
RDW: 12.3 % (ref 11.7–15.4)
WBC: 4.9 10*3/uL (ref 3.4–10.8)

## 2022-08-30 LAB — CMP14+EGFR
ALT: 20 IU/L (ref 0–32)
AST: 16 IU/L (ref 0–40)
Albumin: 4.3 g/dL (ref 3.9–4.9)
Alkaline Phosphatase: 63 IU/L (ref 44–121)
BUN/Creatinine Ratio: 16 (ref 9–23)
BUN: 12 mg/dL (ref 6–24)
Bilirubin Total: 0.2 mg/dL (ref 0.0–1.2)
CO2: 25 mmol/L (ref 20–29)
Calcium: 9.5 mg/dL (ref 8.7–10.2)
Chloride: 102 mmol/L (ref 96–106)
Creatinine, Ser: 0.75 mg/dL (ref 0.57–1.00)
Globulin, Total: 2.3 g/dL (ref 1.5–4.5)
Glucose: 96 mg/dL (ref 70–99)
Potassium: 4.5 mmol/L (ref 3.5–5.2)
Sodium: 139 mmol/L (ref 134–144)
Total Protein: 6.6 g/dL (ref 6.0–8.5)
eGFR: 97 mL/min/{1.73_m2} (ref >59)

## 2022-08-30 LAB — VITAMIN D 25 HYDROXY (VIT D DEFICIENCY, FRACTURES): Vit D, 25-Hydroxy: 57 ng/mL (ref 30.0–100.0)

## 2022-08-30 LAB — THYROID PANEL WITH TSH
Free Thyroxine Index: 2.7 (ref 1.2–4.9)
T3 Uptake Ratio: 28 % (ref 24–39)
T4, Total: 9.5 ug/dL (ref 4.5–12.0)
TSH: 0.742 u[IU]/mL (ref 0.450–4.500)

## 2022-08-30 LAB — LIPID PANEL
Chol/HDL Ratio: 4.5 ratio — ABNORMAL HIGH (ref 0.0–4.4)
Cholesterol, Total: 291 mg/dL — ABNORMAL HIGH (ref 100–199)
HDL: 65 mg/dL (ref >39)
LDL Chol Calc (NIH): 216 mg/dL — ABNORMAL HIGH (ref 0–99)
Triglycerides: 68 mg/dL (ref 0–149)
VLDL Cholesterol Cal: 10 mg/dL (ref 5–40)

## 2022-09-27 ENCOUNTER — Ambulatory Visit
Admission: RE | Admit: 2022-09-27 | Discharge: 2022-09-27 | Disposition: A | Discharge status: Home / Self Care | Payer: BC Managed Care – PPO | Source: Ambulatory Visit | Attending: Interventional Radiology | Admitting: Interventional Radiology

## 2023-03-04 ENCOUNTER — Other Ambulatory Visit: Payer: Self-pay | Admitting: Family Medicine

## 2023-03-04 DIAGNOSIS — E039 Hypothyroidism, unspecified: Secondary | ICD-10-CM

## 2023-03-05 MED ORDER — LEVOTHYROXINE SODIUM 112 MCG PO TABS: 112.0000 ug | ORAL_TABLET | Freq: Every day | ORAL | 1 refills | Status: DC

## 2023-03-05 NOTE — Addendum Note (Signed)
 Addended by: Julious Payer D on: 03/05/2023 10:37 AM   Modules accepted: Orders

## 2023-03-08 ENCOUNTER — Encounter: Payer: Self-pay | Admitting: Family Medicine

## 2023-03-08 ENCOUNTER — Ambulatory Visit: Admitting: Family Medicine

## 2023-03-08 VITALS — BP 125/80 | HR 75 | Temp 98.5°F | Ht 63.0 in | Wt 172.0 lb

## 2023-03-08 DIAGNOSIS — J0140 Acute pansinusitis, unspecified: Secondary | ICD-10-CM | POA: Diagnosis not present

## 2023-03-08 DIAGNOSIS — J069 Acute upper respiratory infection, unspecified: Secondary | ICD-10-CM | POA: Diagnosis not present

## 2023-03-08 DIAGNOSIS — R062 Wheezing: Secondary | ICD-10-CM | POA: Diagnosis not present

## 2023-03-08 MED ORDER — DOXYCYCLINE HYCLATE 100 MG PO TABS: 100.0000 mg | ORAL_TABLET | Freq: Two times a day (BID) | ORAL | 0 refills | Status: AC

## 2023-03-08 MED ORDER — ALBUTEROL SULFATE HFA 108 (90 BASE) MCG/ACT IN AERS: 2.0000 | INHALATION_SPRAY | Freq: Four times a day (QID) | RESPIRATORY_TRACT | 0 refills | Status: DC | PRN

## 2023-03-08 MED ORDER — FLUTICASONE PROPIONATE 50 MCG/ACT NA SUSP: 1.0000 | Freq: Every day | NASAL | 1 refills | Status: DC

## 2023-03-08 NOTE — Progress Notes (Signed)
 Subjective:  Patient ID: Kimberly Brennan, female    DOB: 1970/04/28, 53 y.o.   MRN: [id number]  Patient Care Team: Sonny Masters, FNP as PCP - General (Family Medicine)   Chief Complaint:  No chief complaint on file.   HPI: Kimberly Brennan is a 53 y.o. female presenting on 03/08/2023 for No chief complaint on file.  HPI 1. URI with cough and congestion States that she started having cold symptoms the last two weeks. Reports that she has nasal congestion, hot and flushed especially at night, sore throat, cough. Endorses ear pain. Denies teeth pain. Has not checked temperature. Denies N/V/D. Trying sinus cleanse. Reports that she started getting better and then got worse. No recent abx. Taking [facility] sinus and headache and nyquil with honey.   Relevant past medical, surgical, family, and social history reviewed and updated as indicated.  Allergies and medications reviewed and updated. Data reviewed: Chart in Epic.   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   Constipation 10/02/2018   HLD (hyperlipidemia) 10/02/2018   Overweight (BMI 25.0-29.9) 10/02/2018   Thyroid disease    Vaginal Pap smear, abnormal    Vitamin D deficiency disease 10/02/2018    Past Surgical History:  Procedure Laterality Date   CHOLECYSTECTOMY     LEEP     MOUTH SURGERY      Social History   Socioeconomic History   Marital status: Married    Spouse name: Not on file   Number of children: Not on file   Years of education: Not on file   Highest education level: Not on file  Occupational History   Not on file  Tobacco Use   Smoking status: Never   Smokeless tobacco: Never  Vaping Use   Vaping status: Never Used  Substance and Sexual Activity   Alcohol use: No   Drug use: No   Sexual activity: Not Currently    Birth control/protection: None  Other Topics Concern   Not on file  Social History Narrative   ** Merged History Encounter **       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 Married for x 1 year. No children.   Social Drivers of Corporate investment banker Strain: Not on file  Food Insecurity: Not on file  Transportation Needs: Not on file  Physical Activity: Not on file  Stress: Not on file  Social Connections: Not on file  Intimate Partner Violence: Not on file    Outpatient Encounter Medications as of 03/08/2023  Medication Sig   Cholecalciferol (VITAMIN D-3) 125 MCG (5000 UT) TABS Take 1 tablet by mouth daily.   fluticasone (FLONASE) 50 MCG/ACT nasal spray SPRAY 2 SPRAYS INTO EACH NOSTRIL EVERY DAY   levothyroxine (SYNTHROID) 112 MCG tablet Take 1 tablet (112 mcg total) by mouth daily.   Red Yeast Rice Extract (RED YEAST RICE PO) Take by mouth.   No facility-administered encounter medications on file as of 03/08/2023.    No Known Allergies  Review of Systems As per HPI  Objective:  BP 125/80   Pulse 75   Temp 98.5 F (36.9 C)   Ht 5\' 3"  (1.6 m)   Wt 172 lb (78 kg)   LMP 02/27/2023 (Approximate)   SpO2 98%   BMI 30.47 kg/m    Wt Readings from Last 3 Encounters:  03/08/23 172 lb (  78 kg)  08/29/22 174 lb 9.6 oz (79.2 kg)  09/21/21 165 lb (74.8 kg)    Physical Exam Constitutional:      General: She is awake. She is not in acute distress.    Appearance: Normal appearance. She is well-developed and well-groomed. She is ill-appearing. She is not toxic-appearing or diaphoretic.  HENT:     Right Ear: There is impacted cerumen.     Left Ear: There is impacted cerumen.     Nose: Congestion and rhinorrhea present. Rhinorrhea is clear.     Right Nostril: No occlusion.     Left Nostril: No occlusion.     Right Sinus: Frontal sinus tenderness present. No maxillary sinus tenderness.     Left Sinus: Frontal sinus tenderness present. No maxillary sinus tenderness.     Mouth/Throat:     Lips: Pink. No lesions.     Mouth: Mucous  membranes are moist.     Tongue: No lesions.     Pharynx: Posterior oropharyngeal erythema present. No pharyngeal swelling.     Tonsils: No tonsillar exudate or tonsillar abscesses.  Cardiovascular:     Rate and Rhythm: Normal rate and regular rhythm.     Pulses: Normal pulses.          Radial pulses are 2+ on the right side and 2+ on the left side.       Posterior tibial pulses are 2+ on the right side and 2+ on the left side.     Heart sounds: Normal heart sounds. No murmur heard.    No gallop.  Pulmonary:     Effort: Pulmonary effort is normal. No respiratory distress.     Breath sounds: No stridor. Examination of the right-lower field reveals wheezing. Wheezing present. No rhonchi or rales.     Comments: Wheeze cleared with cough and deep breathing Musculoskeletal:     Cervical back: Full passive range of motion without pain and neck supple.     Right lower leg: No edema.     Left lower leg: No edema.  Skin:    General: Skin is warm.     Capillary Refill: Capillary refill takes less than 2 seconds.  Neurological:     General: No focal deficit present.     Mental Status: She is alert, oriented to person, place, and time and easily aroused. Mental status is at baseline.     GCS: GCS eye subscore is 4. GCS verbal subscore is 5. GCS motor subscore is 6.     Motor: No weakness.  Psychiatric:        Attention and Perception: Attention and perception normal.        Mood and Affect: Mood and affect normal.        Speech: Speech normal.        Behavior: Behavior normal. Behavior is cooperative.        Thought Content: Thought content normal. Thought content does not include homicidal or suicidal ideation. Thought content does not include homicidal or suicidal plan.        Cognition and Memory: Cognition and memory normal.        Judgment: Judgment normal.     Results for orders placed or performed in visit on 08/29/22  CBC with Differential/Platelet   Collection Time: 08/29/22   8:43 AM  Result Value Ref Range   WBC 4.9 3.4 - 10.8 x10E3/uL   RBC 4.60 3.77 - 5.28 x10E6/uL   Hemoglobin 13.8 11.1 - 15.9 g/dL  Hematocrit 43.2 34.0 - 46.6 %   MCV 94 79 - 97 fL   MCH 30.0 26.6 - 33.0 pg   MCHC 31.9 31.5 - 35.7 g/dL   RDW 16.1 09.6 - 04.5 %   Platelets 188 150 - 450 x10E3/uL   Neutrophils 55 Not Estab. %   Lymphs 35 Not Estab. %   Monocytes 8 Not Estab. %   Eos 1 Not Estab. %   Basos 1 Not Estab. %   Neutrophils Absolute 2.7 1.4 - 7.0 x10E3/uL   Lymphocytes Absolute 1.7 0.7 - 3.1 x10E3/uL   Monocytes Absolute 0.4 0.1 - 0.9 x10E3/uL   EOS (ABSOLUTE) 0.1 0.0 - 0.4 x10E3/uL   Basophils Absolute 0.0 0.0 - 0.2 x10E3/uL   Immature Granulocytes 0 Not Estab. %   Immature Grans (Abs) 0.0 0.0 - 0.1 x10E3/uL  CMP14+EGFR   Collection Time: 08/29/22  8:43 AM  Result Value Ref Range   Glucose 96 70 - 99 mg/dL   BUN 12 6 - 24 mg/dL   Creatinine, Ser 4.09 0.57 - 1.00 mg/dL   eGFR 97 >81 XB/JYN/8.29   BUN/Creatinine Ratio 16 9 - 23   Sodium 139 134 - 144 mmol/L   Potassium 4.5 3.5 - 5.2 mmol/L   Chloride 102 96 - 106 mmol/L   CO2 25 20 - 29 mmol/L   Calcium 9.5 8.7 - 10.2 mg/dL   Total Protein 6.6 6.0 - 8.5 g/dL   Albumin 4.3 3.9 - 4.9 g/dL   Globulin, Total 2.3 1.5 - 4.5 g/dL   Bilirubin Total 0.2 0.0 - 1.2 mg/dL   Alkaline Phosphatase 63 44 - 121 IU/L   AST 16 0 - 40 IU/L   ALT 20 0 - 32 IU/L  Lipid panel   Collection Time: 08/29/22  8:43 AM  Result Value Ref Range   Cholesterol, Total 291 (H) 100 - 199 mg/dL   Triglycerides 68 0 - 149 mg/dL   HDL 65 >56 mg/dL   VLDL Cholesterol Cal 10 5 - 40 mg/dL   LDL Chol Calc (NIH) 213 (H) 0 - 99 mg/dL   LDL CALC COMMENT: Comment    Chol/HDL Ratio 4.5 (H) 0.0 - 4.4 ratio  Thyroid Panel With TSH   Collection Time: 08/29/22  8:43 AM  Result Value Ref Range   TSH 0.742 0.450 - 4.500 uIU/mL   T4, Total 9.5 4.5 - 12.0 ug/dL   T3 Uptake Ratio 28 24 - 39 %   Free Thyroxine Index 2.7 1.2 - 4.9  VITAMIN D 25 Hydroxy (Vit-D  Deficiency, Fractures)   Collection Time: 08/29/22  8:43 AM  Result Value Ref Range   Vit D, 25-Hydroxy 57.0 30.0 - 100.0 ng/mL       03/08/2023   12:00 PM 08/29/2022    8:32 AM 09/21/2021    8:48 AM 08/19/2021    9:56 AM 05/10/2021   10:07 AM  Depression screen PHQ 2/9  Decreased Interest 1 0 0 0 0  Down, Depressed, Hopeless 1 0 0 0 0  PHQ - 2 Score 2 0 0 0 0  Altered sleeping 2 2 0 0 0  Tired, decreased energy 2 2 2 1 1   Change in appetite 1 1 0 0 0  Feeling bad or failure about yourself  1 0 0 1 0  Trouble concentrating 1 0 1 0 1  Moving slowly or fidgety/restless 1 0 0 0 0  Suicidal thoughts 0 0 0 0 0  PHQ-9 Score 10 5 3  2 2  Difficult doing work/chores Somewhat difficult Not difficult at all Not difficult at all Not difficult at all Not difficult at all       03/08/2023   12:00 PM 08/29/2022    8:32 AM 09/21/2021    8:48 AM 08/19/2021    9:56 AM  GAD 7 : Generalized Anxiety Score  Nervous, Anxious, on Edge 1 1 0 1  Control/stop worrying 1 1 0 0  Worry too much - different things 2 0 0 1  Trouble relaxing 1 0 1 1  Restless 1 1 0 0  Easily annoyed or irritable 2 1 1  0  Afraid - awful might happen 1 0 0 1  Total GAD 7 Score 9 4 2 4   Anxiety Difficulty Somewhat difficult Not difficult at all Not difficult at all Not difficult at all      Pertinent labs & imaging results that were available during my care of the patient were reviewed by me and considered in my medical decision making.  Assessment & Plan:  Diagnoses and all orders for this visit:  Wheeze Will start medication as below. Providing albuterol and flonase for symptom management.  -     doxycycline (VIBRA-TABS) 100 MG tablet; Take 1 tablet (100 mg total) by mouth 2 (two) times daily for 7 days. -     albuterol (VENTOLIN HFA) 108 (90 Base) MCG/ACT inhaler; Inhale 2 puffs into the lungs every 6 (six) hours as needed for wheezing or shortness of breath.  URI with cough and congestion -     fluticasone (FLONASE)  50 MCG/ACT nasal spray; Place 1 spray into both nostrils daily. -     doxycycline (VIBRA-TABS) 100 MG tablet; Take 1 tablet (100 mg total) by mouth 2 (two) times daily for 7 days.  Acute non-recurrent pansinusitis -     fluticasone (FLONASE) 50 MCG/ACT nasal spray; Place 1 spray into both nostrils daily. -     doxycycline (VIBRA-TABS) 100 MG tablet; Take 1 tablet (100 mg total) by mouth 2 (two) times daily for 7 days.     Continue all other maintenance medications.  Follow up plan: Return if symptoms worsen or fail to improve.   Continue healthy lifestyle choices, including diet (rich in fruits, vegetables, and lean proteins, and low in salt and simple carbohydrates) and exercise (at least 30 minutes of moderate physical activity daily).  Written and verbal instructions provided   The above assessment and management plan was discussed with the patient. The patient verbalized understanding of and has agreed to the management plan. Patient is aware to call the clinic if they develop any new symptoms or if symptoms persist or worsen. Patient is aware when to return to the clinic for a follow-up visit. Patient educated on when it is appropriate to go to the emergency department.   Neale Burly, DNP-FNP [facility] [address] [place], [place] [address] [phone]

## 2023-08-30 ENCOUNTER — Ambulatory Visit (INDEPENDENT_AMBULATORY_CARE_PROVIDER_SITE_OTHER): Payer: BC Managed Care – PPO | Admitting: Family Medicine

## 2023-08-30 ENCOUNTER — Encounter: Payer: Self-pay | Admitting: Family Medicine

## 2023-08-30 VITALS — BP 126/76 | HR 69 | Temp 97.8°F | Ht 63.0 in | Wt 159.4 lb

## 2023-08-30 DIAGNOSIS — Z0001 Encounter for general adult medical examination with abnormal findings: Secondary | ICD-10-CM | POA: Diagnosis not present

## 2023-08-30 DIAGNOSIS — Z1231 Encounter for screening mammogram for malignant neoplasm of breast: Secondary | ICD-10-CM

## 2023-08-30 DIAGNOSIS — E782 Mixed hyperlipidemia: Secondary | ICD-10-CM

## 2023-08-30 DIAGNOSIS — Z0184 Encounter for antibody response examination: Secondary | ICD-10-CM

## 2023-08-30 DIAGNOSIS — E039 Hypothyroidism, unspecified: Secondary | ICD-10-CM | POA: Diagnosis not present

## 2023-08-30 DIAGNOSIS — Z0000 Encounter for general adult medical examination without abnormal findings: Secondary | ICD-10-CM

## 2023-08-30 DIAGNOSIS — E559 Vitamin D deficiency, unspecified: Secondary | ICD-10-CM | POA: Diagnosis not present

## 2023-08-30 DIAGNOSIS — Z1211 Encounter for screening for malignant neoplasm of colon: Secondary | ICD-10-CM

## 2023-08-30 LAB — LIPID PANEL

## 2023-08-30 MED ORDER — LEVOTHYROXINE SODIUM 112 MCG PO TABS: 112.0000 ug | ORAL_TABLET | Freq: Every day | ORAL | 1 refills | Status: DC

## 2023-08-30 NOTE — Progress Notes (Signed)
 Complete physical exam  Patient: Kimberly Brennan   DOB: 31-Aug-1970   53 y.o. Female  MRN: [id number]  Subjective:    Chief Complaint  Patient presents with   Annual Exam    Kimberly Brennan is a 53 y.o. female who presents today for a complete physical exam. She reports consuming a general diet. The patient does not participate in regular exercise at present. She generally feels well. She reports sleeping well. She does not have additional problems to discuss today.    Kimberly Brennan is a 53 year old female who presents for an annual physical exam.  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 She feels anxious and occasionally irritated, especially with the start of the school year and her demanding schedule.  She is currently taking levothyroxine  112 mcg daily for her thyroid  condition and feels better when she takes it consistently. However, she has difficulty maintaining a regular schedule for her medication due to recent life changes, including a move. She also takes red yeast rice for cholesterol management, though only half the recommended dose.  She reports a fall that occurred over a month ago while moving, resulting in bruising and persistent pain in her wrist and ankle. She did not seek medical attention at the time and has been managing the symptoms with Epsom salt baths. The bruising and pain have been slow to resolve, and the pain sometimes returns with use.  She mentions hair loss, which she attributes to stress and possibly her thyroid  condition. Her last menstrual cycle was in mid-July, with minimal bleeding and only one heavy day. No night sweats or significant weight changes, although she notes some weight loss due to her busy schedule and stress.  Her vision has not been checked since last year, and she acknowledges the need for an eye exam. She lives in  [place] and has adjusted her healthcare providers accordingly, now using [facility] for her prescriptions.      Most recent fall risk assessment:    08/30/2023    8:26 AM  Fall Risk   Falls in the past year? 1  Number falls in past yr: 1  Injury with Fall? 1  Risk for fall due to : History of fall(s)  Follow up Falls evaluation completed     Most recent depression screenings:    08/30/2023    8:26 AM 03/08/2023   12:00 PM  PHQ 2/9 Scores  PHQ - 2 Score 2 2  PHQ- 9 Score 8 10    Vision:Not within last year  and Dental: No current dental problems  Patient Active Problem List   Diagnosis Date Noted   Seasonal allergic rhinitis due to pollen 05/10/2021   Depression, major, single episode, moderate (HCC) 02/02/2021   GAD (generalized anxiety disorder) 02/02/2021   Hypothyroidism 10/12/2020   HLD (hyperlipidemia) 10/02/2018   Vitamin D  deficiency 10/02/2018   Constipation 10/02/2018   Overweight (BMI 25.0-29.9) 10/02/2018   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   Constipation 10/02/2018   HLD (hyperlipidemia) 10/02/2018   Overweight (BMI 25.0-29.9) 10/02/2018   Thyroid  disease    Vaginal Pap smear, abnormal    Vitamin D  deficiency disease 10/02/2018   Past Surgical History:  Procedure Laterality Date   CHOLECYSTECTOMY     LEEP     MOUTH SURGERY     Social History   Tobacco Use  Smoking status: Never   Smokeless tobacco: Never  Vaping Use   Vaping status: Never Used  Substance Use Topics   Alcohol use: No   Drug use: No   Social History   Socioeconomic History   Marital status: Married    Spouse name: Not on file   Number of children: Not on file   Years of education: Not on file   Highest education level: Not on file  Occupational History   Not on file  Tobacco Use   Smoking status: Never   Smokeless tobacco: Never  Vaping Use   Vaping status: Never Used  Substance and Sexual Activity   Alcohol use: No   Drug  use: No   Sexual activity: Not Currently    Birth control/protection: None  Other Topics Concern   Not on file  Social History Narrative   ** Merged History Encounter **       Works as a Surveyor, 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 Married for x 1 year. No children.   Social Drivers of Corporate investment banker Strain: Not on file  Food Insecurity: Not on file  Transportation Needs: Not on file  Physical Activity: Not on file  Stress: Not on file  Social Connections: Not on file  Intimate Partner Violence: Not on file   Family Status  Relation Name Status   Mother  Alive   Father  Deceased   Sister  Alive   MGM  Deceased   MGF  Deceased   PGM  Deceased   PGF  Deceased   Neg Hx  (Not Specified)  No partnership data on file   Family History  Adopted: Yes  Problem Relation Age of Onset   Breast cancer Neg Hx    No Known Allergies    Patient Care Team: Severa Rock HERO, FNP as PCP - General (Family Medicine)   Outpatient Medications Prior to Visit  Medication Sig   albuterol  (VENTOLIN  HFA) 108 (90 Base) MCG/ACT inhaler Inhale 2 puffs into the lungs every 6 (six) hours as needed for wheezing or shortness of breath.   Cholecalciferol (VITAMIN D -3) 125 MCG (5000 UT) TABS Take 1 tablet by mouth daily.   fluticasone  (FLONASE ) 50 MCG/ACT nasal spray Place 1 spray into both nostrils daily.   Red Yeast Rice Extract (RED YEAST RICE PO) Take by mouth.   [DISCONTINUED] levothyroxine  (SYNTHROID ) 112 MCG tablet Take 1 tablet (112 mcg total) by mouth daily.   No facility-administered medications prior to visit.    ROS per HPI     Objective:     BP 126/76   Pulse 69   Temp 97.8 F (36.6 C)   Ht 5' 3 (1.6 m)   Wt 159 lb 6.4 oz (72.3 kg)   SpO2 99%   BMI 28.24 kg/m  BP Readings from Last 3 Encounters:  08/30/23 126/76  03/08/23 125/80  08/29/22 (!) 148/81   Wt Readings from Last 3  Encounters:  08/30/23 159 lb 6.4 oz (72.3 kg)  03/08/23 172 lb (78 kg)  08/29/22 174 lb 9.6 oz (79.2 kg)   SpO2 Readings from Last 3 Encounters:  08/30/23 99%  03/08/23 98%  08/29/22 99%      Physical Exam Vitals and nursing note reviewed.  Constitutional:      General: She is not in acute distress.    Appearance: Normal appearance. She is not ill-appearing, toxic-appearing or diaphoretic.  HENT:     Head: Normocephalic and atraumatic.     Right Ear: Tympanic membrane, ear canal and external ear normal.     Left Ear: Tympanic membrane, ear canal and external ear normal.     Nose: Nose normal.     Mouth/Throat:     Mouth: Mucous membranes are moist.     Pharynx: Oropharynx is clear.  Eyes:     Conjunctiva/sclera: Conjunctivae normal.     Pupils: Pupils are equal, round, and reactive to light.  Neck:     Thyroid : No thyroid  mass, thyromegaly or thyroid  tenderness.  Cardiovascular:     Rate and Rhythm: Normal rate and regular rhythm.     Heart sounds: Normal heart sounds.  Pulmonary:     Effort: Pulmonary effort is normal.     Breath sounds: Normal breath sounds.  Musculoskeletal:     Right forearm: Normal.     Right wrist: Tenderness present. No swelling, deformity, effusion, lacerations, bony tenderness, snuff box tenderness or crepitus. Normal range of motion. Normal pulse.     Right hand: Normal.     Cervical back: Normal range of motion and neck supple.     Right lower leg: No edema.     Left lower leg: No edema.     Left ankle: Normal.  Lymphadenopathy:     Cervical: No cervical adenopathy.  Skin:    General: Skin is warm and dry.     Capillary Refill: Capillary refill takes less than 2 seconds.  Neurological:     General: No focal deficit present.     Mental Status: She is alert and oriented to person, place, and time.  Psychiatric:        Mood and Affect: Mood normal.        Behavior: Behavior normal.        Thought Content: Thought content normal.         Judgment: Judgment normal.       Last CBC Lab Results  Component Value Date   WBC 4.9 08/29/2022   HGB 13.8 08/29/2022   HCT 43.2 08/29/2022   MCV 94 08/29/2022   MCH 30.0 08/29/2022   RDW 12.3 08/29/2022   PLT 188 08/29/2022   Last metabolic panel Lab Results  Component Value Date   GLUCOSE 96 08/29/2022   NA 139 08/29/2022   K 4.5 08/29/2022   CL 102 08/29/2022   CO2 25 08/29/2022   BUN 12 08/29/2022   CREATININE 0.75 08/29/2022   EGFR 97 08/29/2022   CALCIUM 9.5 08/29/2022   PROT 6.6 08/29/2022   ALBUMIN 4.3 08/29/2022   LABGLOB 2.3 08/29/2022   AGRATIO 1.9 08/19/2021   BILITOT 0.2 08/29/2022   ALKPHOS 63 08/29/2022   AST 16 08/29/2022   ALT 20 08/29/2022   Last lipids Lab Results  Component Value Date   CHOL 291 (H) 08/29/2022   HDL 65 08/29/2022   LDLCALC 216 (H) 08/29/2022   TRIG 68 08/29/2022   CHOLHDL 4.5 (H) 08/29/2022   Last hemoglobin A1c Lab Results  Component Value Date   HGBA1C 5.4 01/21/2020   Last thyroid  functions Lab Results  Component Value Date   TSH 0.742 08/29/2022   T4TOTAL 9.5 08/29/2022   Last vitamin D  Lab Results  Component Value Date   VD25OH 57.0 08/29/2022        Assessment & Plan:    Routine Health Maintenance and Physical Exam  Immunization History  Administered Date(s) Administered   PFIZER(Purple Top)SARS-COV-2 Vaccination  03/09/2019, 03/30/2019   Tdap 09/21/2016    Health Maintenance  Topic Date Due   Zoster Vaccines- Shingrix (1 of 2) 11/30/2023 (Originally 01/07/2022)   INFLUENZA VACCINE  04/01/2024 (Originally 08/03/2023)   Pneumococcal Vaccine: 50+ Years (1 of 1 - PCV) 08/29/2024 (Originally 01/07/2022)   Hepatitis B Vaccines 19-59 Average Risk (1 of 3 - 19+ 3-dose series) 08/29/2024 (Originally 01/08/1991)   Fecal DNA (Cologuard)  10/27/2023   MAMMOGRAM  09/26/2024   Cervical Cancer Screening (HPV/Pap Cotest)  08/20/2026   DTaP/Tdap/Td (2 - Td or Tdap) 09/22/2026   Hepatitis C Screening  Completed    HIV Screening  Completed   HPV VACCINES  Aged Out   Meningococcal B Vaccine  Aged Out   COVID-19 Vaccine  Discontinued    Discussed health benefits of physical activity, and encouraged her to engage in regular exercise appropriate for her age and condition.  Problem List Items Addressed This Visit       Endocrine   Hypothyroidism   Relevant Medications   levothyroxine  (SYNTHROID ) 112 MCG tablet   Other Relevant Orders   Thyroid  Panel With TSH     Other   HLD (hyperlipidemia)   Relevant Orders   CMP14+EGFR   Lipid panel   Vitamin D  deficiency   Relevant Orders   CMP14+EGFR   Vitamin D , 25-hydroxy   Other Visit Diagnoses       Annual physical exam    -  Primary   Relevant Orders   CBC with Differential/Platelet   CMP14+EGFR   Lipid panel   Hepatitis B surface antibody,qualitative   MM 3D SCREENING MAMMOGRAM BILATERAL BREAST     Screening for colorectal cancer       Relevant Orders   Cologuard     Encounter for screening mammogram for malignant neoplasm of breast       Relevant Orders   MM 3D SCREENING MAMMOGRAM BILATERAL BREAST     Immunity status testing       Relevant Orders   Hepatitis B surface antibody,qualitative     Assessment and Plan    Adult Wellness Visit 53 year old female presenting for annual physical examination. Reports significant stress due to work and divorce. Reports several health changes since last year. Plans to visit Dr. Jama for an eye exam. No significant weight changes, night sweats, or other concerning symptoms. - Order mammogram at [facility] - Order Cologuard for colorectal cancer screening - Check hepatitis B titer with labs - Update labs including vitamin D , thyroid , cholesterol, blood counts, liver and kidney function, and electrolytes  Anxiety, situational Experiencing situational anxiety due to work and divorce. Managing stress well without medication, though occasional anxiety and irritation occur. Does not feel the need for  medication at this time.  Hypothyroidism Currently taking levothyroxine  112 mcg daily. Reports difficulty in consistently taking medication due to life changes. Feels better when medication is taken regularly. Importance of daily thyroid  medication emphasized to avoid feeling unwell. - Check thyroid  function as part of lab work - Advise to place thyroid  medication next to toothbrush to help remember to take it daily  Hyperlipidemia Taking red yeast rice for cholesterol at half the recommended dose. Emphasized importance of taking thyroid  medication regularly.  Ankle and wrist sprain/strain Experienced a fall over a month ago, resulting in a sprain/strain of wrist and ankle. Ankle healing but still discomfort after use. Soaking ankle provides relief. Explained that strains and sprains take time to heal due to constant use of ligaments, tendons, and muscles. -  Recommend using an over-the-counter wrist brace for support, especially while driving - Advise to monitor symptoms and return if no improvement  Menstrual irregularity Last menstrual cycle was on July 15-17, very light with only one heavy day. No current concerns regarding menstrual cycle.       Return in about 1 year (around 08/29/2024), or if symptoms worsen or fail to improve, for Annual Physical.     Rosaline Bruns, FNP

## 2023-08-31 ENCOUNTER — Ambulatory Visit: Payer: Self-pay | Admitting: Family Medicine

## 2023-08-31 LAB — CBC WITH DIFFERENTIAL/PLATELET
Basophils Absolute: 0 x10E3/uL (ref 0.0–0.2)
Basos: 1 %
EOS (ABSOLUTE): 0.1 x10E3/uL (ref 0.0–0.4)
Eos: 2 %
Hematocrit: 42.1 % (ref 34.0–46.6)
Hemoglobin: 13.7 g/dL (ref 11.1–15.9)
Immature Grans (Abs): 0 x10E3/uL (ref 0.0–0.1)
Immature Granulocytes: 0 %
Lymphocytes Absolute: 1.8 x10E3/uL (ref 0.7–3.1)
Lymphs: 40 %
MCH: 30 pg (ref 26.6–33.0)
MCHC: 32.5 g/dL (ref 31.5–35.7)
MCV: 92 fL (ref 79–97)
Monocytes Absolute: 0.3 x10E3/uL (ref 0.1–0.9)
Monocytes: 7 %
Neutrophils Absolute: 2.3 x10E3/uL (ref 1.4–7.0)
Neutrophils: 50 %
Platelets: 195 x10E3/uL (ref 150–450)
RBC: 4.57 x10E6/uL (ref 3.77–5.28)
RDW: 12.5 % (ref 11.7–15.4)
WBC: 4.5 x10E3/uL (ref 3.4–10.8)

## 2023-08-31 LAB — CMP14+EGFR
ALT: 11 IU/L (ref 0–32)
AST: 15 IU/L (ref 0–40)
Albumin: 4.4 g/dL (ref 3.8–4.9)
Alkaline Phosphatase: 70 IU/L (ref 44–121)
BUN/Creatinine Ratio: 15 (ref 9–23)
BUN: 11 mg/dL (ref 6–24)
Bilirubin Total: 0.3 mg/dL (ref 0.0–1.2)
CO2: 21 mmol/L (ref 20–29)
Calcium: 9.4 mg/dL (ref 8.7–10.2)
Chloride: 102 mmol/L (ref 96–106)
Creatinine, Ser: 0.71 mg/dL (ref 0.57–1.00)
Globulin, Total: 2.3 g/dL (ref 1.5–4.5)
Glucose: 92 mg/dL (ref 70–99)
Potassium: 4.1 mmol/L (ref 3.5–5.2)
Sodium: 141 mmol/L (ref 134–144)
Total Protein: 6.7 g/dL (ref 6.0–8.5)
eGFR: 103 mL/min/1.73 (ref >59)

## 2023-08-31 LAB — LIPID PANEL
Cholesterol, Total: 230 mg/dL — AB (ref 100–199)
HDL: 59 mg/dL (ref >39)
LDL CALC COMMENT:: 3.9 ratio (ref 0.0–4.4)
LDL Chol Calc (NIH): 156 mg/dL — AB (ref 0–99)
Triglycerides: 84 mg/dL (ref 0–149)
VLDL Cholesterol Cal: 15 mg/dL (ref 5–40)

## 2023-08-31 LAB — THYROID PANEL WITH TSH
Free Thyroxine Index: 2.6 (ref 1.2–4.9)
T3 Uptake Ratio: 27 (ref 24–39)
T4, Total: 9.6 ug/dL (ref 4.5–12.0)
TSH: 0.15 u[IU]/mL — AB (ref 0.450–4.500)

## 2023-08-31 LAB — HEPATITIS B SURFACE ANTIBODY,QUALITATIVE: Hep B Surface Ab, Qual: NONREACTIVE

## 2023-08-31 LAB — VITAMIN D 25 HYDROXY (VIT D DEFICIENCY, FRACTURES): Vit D, 25-Hydroxy: 48.6 ng/mL (ref 30.0–100.0)

## 2023-09-05 NOTE — Telephone Encounter (Signed)
 Looks like patient may have already seen results via mychart but I did leave her a message to call back if she hasn't seen them or if she has any questions.

## 2023-09-06 ENCOUNTER — Other Ambulatory Visit (HOSPITAL_COMMUNITY): Payer: Self-pay | Admitting: Family Medicine

## 2023-09-06 DIAGNOSIS — Z1231 Encounter for screening mammogram for malignant neoplasm of breast: Secondary | ICD-10-CM

## 2023-09-19 LAB — COLOGUARD: COLOGUARD: NEGATIVE

## 2023-09-26 ENCOUNTER — Other Ambulatory Visit (HOSPITAL_COMMUNITY): Payer: Self-pay | Admitting: Family Medicine

## 2023-09-26 DIAGNOSIS — Z1231 Encounter for screening mammogram for malignant neoplasm of breast: Secondary | ICD-10-CM

## 2023-09-28 ENCOUNTER — Ambulatory Visit (HOSPITAL_COMMUNITY): Payer: Self-pay

## 2023-09-28 ENCOUNTER — Ambulatory Visit (HOSPITAL_COMMUNITY)
Admission: RE | Admit: 2023-09-28 | Discharge: 2023-09-28 | Disposition: A | Discharge status: Home / Self Care | Payer: Self-pay | Source: Ambulatory Visit | Attending: Family Medicine | Admitting: Family Medicine

## 2023-09-28 DIAGNOSIS — Z1231 Encounter for screening mammogram for malignant neoplasm of breast: Secondary | ICD-10-CM | POA: Insufficient documentation

## 2023-10-02 ENCOUNTER — Ambulatory Visit: Payer: Self-pay | Admitting: Family Medicine

## 2023-11-29 ENCOUNTER — Encounter (HOSPITAL_COMMUNITY): Payer: Self-pay

## 2023-11-29 ENCOUNTER — Emergency Department (HOSPITAL_COMMUNITY)

## 2023-11-29 ENCOUNTER — Emergency Department (HOSPITAL_COMMUNITY)
Admission: EM | Admit: 2023-11-29 | Discharge: 2023-11-29 | Disposition: A | Discharge status: Home / Self Care | Attending: Emergency Medicine | Admitting: Emergency Medicine

## 2023-11-29 DIAGNOSIS — J40 Bronchitis, not specified as acute or chronic: Secondary | ICD-10-CM | POA: Insufficient documentation

## 2023-11-29 DIAGNOSIS — E039 Hypothyroidism, unspecified: Secondary | ICD-10-CM | POA: Diagnosis not present

## 2023-11-29 DIAGNOSIS — R059 Cough, unspecified: Secondary | ICD-10-CM | POA: Diagnosis present

## 2023-11-29 MED ORDER — PREDNISONE 20 MG PO TABS: 40.0000 mg | ORAL_TABLET | Freq: Every day | ORAL | 0 refills | Status: DC

## 2023-11-29 MED ORDER — BENZONATATE 100 MG PO CAPS
200.0000 mg | ORAL_CAPSULE | Freq: Once | ORAL | Status: AC
  Administered 2023-11-29: 200 mg via ORAL
  Filled 2023-11-29: qty 2

## 2023-11-29 MED ORDER — BENZONATATE 100 MG PO CAPS
200.0000 mg | ORAL_CAPSULE | Freq: Two times a day (BID) | ORAL | 0 refills | Status: AC | PRN
Start: 2023-11-29 — End: ?

## 2023-11-29 NOTE — Discharge Instructions (Signed)
 Your x-ray is normal, no signs of pneumonia  Tessalon  is a cough medication that helps reduce the amount of coughing that you are having.  You may take up to 200 mg every 8 hours as needed.  This can be used safely with most or other over-the-counter medications but talk to your pharmacist before taking anything else over-the-counter with it  Albuterol  is an inhaled medication which can help you to breathe better, you should take 2 puffs every 4 hours as needed, this may cause your heart to feel like it is racing, this should be a temporary side effect.   Prednisone  is a steroid that helps to reduce certain types of inflammation and may be used for allergic reactions, some rashes such as poison ivy or dermatitis, for asthma attacks or bronchitis and for certain types of pain.  Please take this medicine exactly as prescribed - 40mg  by mouth daily for 5 days.  This can have certain side effects with some people including feeling like you can't sleep, feeling anxious or feeling like you are on a high.  It should not cause weight gain if only taken for a short time.  Please be aware that this medication may also cause an elevation in your blood sugar if you are a diabetic so if you are a diabetic you will need to keep a very close eye on your blood sugar, make sure that you are eating an extremely low level of carbohydrates and taking your medications exactly as prescribed.  If you should develop severe high blood sugar or start to feel poorly return to the emergency department immediately    Thank you for allowing us  to treat you in the emergency department today.  After reviewing your examination and potential testing that was done it appears that you are safe to go home.  I would like for you to follow-up with your doctor within the next several days, have them obtain your records and follow-up with them to review all potential tests and results from your visit.  If you should develop severe or worsening  symptoms return to the emergency department immediately

## 2023-11-29 NOTE — ED Triage Notes (Signed)
 Pt comes in for a cough, runny nose, congestion that has been going on for 4 days. Pt will go into a 'coughing fit' to where pt cannot catch her breathe. A&Ox4.

## 2023-11-29 NOTE — ED Provider Notes (Signed)
 [facility] EMERGENCY DEPARTMENT AT [facility] Provider Note   CSN: [id number] Arrival date & time: 11/29/23  1858     Patient presents with: URI   Kimberly Brennan is a 53 y.o. female.    URI  This patient is a 53 year old female, she has been prescribed Synthroid  for hypothyroidism and for a previous upper respiratory illness she had been given albuterol  and Flonase .  For the last week the patient has started to have a tickle in her throat followed by some coughing, this came on almost concurrently with her significant other symptoms of exactly the same upper respiratory symptoms.  She reports to me that she has had a persistent cough which seems to get worse at night, she has significant difficulty controlling the cough, she does not feel particular short of breath and has not had fevers or chills, she does not have any chest pain, she has been taking the albuterol  without relief.  She has no history of reactive lung disease and does not smoke cigarettes.    Prior to Admission medications   Medication Sig Start Date End Date Taking? Authorizing Provider  benzonatate  (TESSALON ) 100 MG capsule Take 2 capsules (200 mg total) by mouth 2 (two) times daily as needed for cough. 11/29/23  Yes Cleotilde Rogue, MD  predniSONE  (DELTASONE ) 20 MG tablet Take 2 tablets (40 mg total) by mouth daily. 11/29/23  Yes Cleotilde Rogue, MD  albuterol  (VENTOLIN  HFA) 108 (90 Base) MCG/ACT inhaler Inhale 2 puffs into the lungs every 6 (six) hours as needed for wheezing or shortness of breath. 03/08/23   Milian, Marry Lenis, FNP  Cholecalciferol (VITAMIN D -3) 125 MCG (5000 UT) TABS Take 1 tablet by mouth daily.    [provider]  fluticasone  (FLONASE ) 50 MCG/ACT nasal spray Place 1 spray into both nostrils daily. 03/08/23   Milian, Marry Lenis, FNP  levothyroxine  (SYNTHROID ) 112 MCG tablet Take 1 tablet (112 mcg total) by mouth daily. 08/30/23   Severa Rock HERO, FNP  Red Yeast Rice Extract  (RED YEAST RICE PO) Take by mouth.    [provider]    Allergies: Patient has no known allergies.    Review of Systems  All other systems reviewed and are negative.   Updated Vital Signs BP (!) 137/91   Pulse 89   Temp 98.5 F (36.9 C) (Oral)   Resp 18   Ht 1.6 m (5' 3)   Wt 72.3 kg   SpO2 97%   BMI 28.24 kg/m   Physical Exam Vitals and nursing note reviewed.  Constitutional:      General: She is not in acute distress.    Appearance: She is well-developed.  HENT:     Head: Normocephalic and atraumatic.     Mouth/Throat:     Pharynx: No oropharyngeal exudate.  Eyes:     General: No scleral icterus.       Right eye: No discharge.        Left eye: No discharge.     Conjunctiva/sclera: Conjunctivae normal.     Pupils: Pupils are equal, round, and reactive to light.  Neck:     Thyroid : No thyromegaly.     Vascular: No JVD.  Cardiovascular:     Rate and Rhythm: Normal rate and regular rhythm.     Heart sounds: Normal heart sounds. No murmur heard.    No friction rub. No gallop.  Pulmonary:     Effort: Pulmonary effort is normal. No respiratory distress.  Breath sounds: Normal breath sounds. No wheezing or rales.  Abdominal:     General: Bowel sounds are normal. There is no distension.     Palpations: Abdomen is soft. There is no mass.     Tenderness: There is no abdominal tenderness.  Musculoskeletal:        General: No tenderness. Normal range of motion.     Cervical back: Normal range of motion and neck supple.     Right lower leg: No edema.     Left lower leg: No edema.  Lymphadenopathy:     Cervical: No cervical adenopathy.  Skin:    General: Skin is warm and dry.     Findings: No erythema or rash.  Neurological:     Mental Status: She is alert.     Coordination: Coordination normal.  Psychiatric:        Behavior: Behavior normal.     (all labs ordered are listed, but only abnormal results are displayed) Labs Reviewed - No data to  display  EKG: None  Radiology: DG Chest 2 View Result Date: 11/29/2023 EXAM: 2 VIEW(S) XRAY OF THE CHEST 11/29/2023 07:31:00 PM COMPARISON: None available. CLINICAL HISTORY: cough cough FINDINGS: LUNGS AND PLEURA: No focal pulmonary opacity. No pleural effusion. No pneumothorax. HEART AND MEDIASTINUM: No acute abnormality of the cardiac and mediastinal silhouettes. BONES AND SOFT TISSUES: No acute osseous abnormality. IMPRESSION: 1. No acute cardiopulmonary disease. Electronically signed by: Franky Crease MD 11/29/2023 07:34 PM EST RP Workstation: [id number]     Procedures   Medications Ordered in the ED  benzonatate  (TESSALON ) capsule 200 mg (200 mg Oral Given 11/29/23 1930)                                    Medical Decision Making Amount and/or Complexity of Data Reviewed Radiology: ordered.  Risk Prescription drug management.    This patient presents to the ED for concern of persistent cough over the week getting worse differential diagnosis includes bronchitis, reactive lung disease, pneumonia, upper respiratory illness    Additional history obtained   Additional history obtained from Electronic Medical Record External records from outside source obtained and reviewed including medical record The patient has been seen in the office as recently as August for an annual physical.  She was seen for wheezing in March of this year, no admissions to the hospital going back several years.  Lab work not indicated  Imaging Studies ordered:  I ordered imaging studies including chest x-ray I independently visualized and interpreted imaging which showed chest x-ray without pneumonia I agree with the radiologist interpretation   Medicines ordered and prescription drug management:  I ordered medication including benzonatate     I have reviewed the patients home medicines and have made adjustments as needed   Problem List / ED Course:  No findings of pneumonia, stable for  discharge with supportive care for bronchitis   Social Determinants of Health:  None        Final diagnoses:  Bronchitis    ED Discharge Orders          Ordered    benzonatate  (TESSALON ) 100 MG capsule  2 times daily PRN        11/29/23 1936    predniSONE  (DELTASONE ) 20 MG tablet  Daily        11/29/23 1936  Cleotilde Rogue, MD 11/29/23 [phone]

## 2023-12-17 ENCOUNTER — Telehealth: Payer: Self-pay | Admitting: Family Medicine

## 2023-12-17 DIAGNOSIS — J069 Acute upper respiratory infection, unspecified: Secondary | ICD-10-CM

## 2023-12-17 DIAGNOSIS — J0140 Acute pansinusitis, unspecified: Secondary | ICD-10-CM

## 2023-12-17 NOTE — Telephone Encounter (Unsigned)
 Copied from CRM [id number]. Topic: Clinical - Medication Refill >> Dec 17, 2023  8:48 AM Graeme ORN wrote: Medication:  fluticasone  (FLONASE ) 50 MCG/ACT nasal spray  predniSONE  (DELTASONE ) 20 MG tablet - prescribed by ED  Has the patient contacted their pharmacy? No (Agent: If no, request that the patient contact the pharmacy for the refill. If patient does not wish to contact the pharmacy document the reason why and proceed with request.) (Agent: If yes, when and what did the pharmacy advise?) New pharmacy  This is the patient's preferred pharmacy:  [id number] - [place], [place] - [address] AT [address] [place] [address] Phone: [phone] Fax: [phone]  Is this the correct pharmacy for this prescription? Yes If no, delete pharmacy and type the correct one.   Has the prescription been filled recently? No  Is the patient out of the medication? Yes   Has the patient been seen for an appointment in the last year OR does the patient have an upcoming appointment? Yes  Can we respond through MyChart? No  Agent: Please be advised that Rx refills may take up to 3 business days. We ask that you follow-up with your pharmacy.   ----------------------------------------------------------------------- From previous Reason for Contact - Scheduling: Patient/patient representative is calling to schedule an appointment. Refer to attachments for appointment information.

## 2023-12-18 MED ORDER — FLUTICASONE PROPIONATE 50 MCG/ACT NA SUSP: 1.0000 | Freq: Every day | NASAL | 1 refills | Status: AC

## 2023-12-18 NOTE — Telephone Encounter (Signed)
 TC back to pt RE: request to RF prednisone , if still having issues w/ bronchitis that she went to ED for & was prescribed this, will have to be seen, we do not RF this medication w/o being seen. Appt made for 12/19/23. Flonase  was sent to pharmacy.

## 2023-12-19 ENCOUNTER — Ambulatory Visit: Admitting: Family Medicine

## 2023-12-19 ENCOUNTER — Encounter: Payer: Self-pay | Admitting: Family Medicine

## 2023-12-19 VITALS — BP 135/89 | HR 66 | Temp 97.8°F | Ht 63.0 in | Wt 167.6 lb

## 2023-12-19 DIAGNOSIS — J209 Acute bronchitis, unspecified: Secondary | ICD-10-CM

## 2023-12-19 MED ORDER — PROMETHAZINE-DM 6.25-15 MG/5ML PO SYRP: 5.0000 mL | ORAL_SOLUTION | Freq: Four times a day (QID) | ORAL | 0 refills | Status: AC | PRN

## 2023-12-19 MED ORDER — PREDNISONE 20 MG PO TABS: 40.0000 mg | ORAL_TABLET | Freq: Every day | ORAL | 0 refills | Status: AC

## 2023-12-19 MED ORDER — AIRSUPRA 90-80 MCG/ACT IN AERO: 1.0000 | INHALATION_SPRAY | Freq: Four times a day (QID) | RESPIRATORY_TRACT | 0 refills | Status: AC | PRN

## 2023-12-19 NOTE — Progress Notes (Signed)
 Subjective:  Patient ID: Kimberly Brennan, female    DOB: 13-May-1970, 53 y.o.   MRN: [id number]  Patient Care Team: Severa Rock HERO, FNP as PCP - General (Family Medicine)   Chief Complaint:  Bronchitis (/11/29/2023 [phone] minutes)/[place] [facility] at [facility]- states she is still having cough and wheezing )   HPI: Kimberly Brennan is a 53 y.o. female presenting on 12/19/2023 for Bronchitis (/11/29/2023 (38 minutes)/ Emergency Department at [facility]- states she is still having cough and wheezing )   Kimberly Brennan is a 53 year old female who presents with a persistent cough following a diagnosis of bronchitis.  She has a persistent cough that has not improved since her visit to the emergency department where she was diagnosed with bronchitis. She completed a five-day course of benoxinate a week ago, hoping the cough would resolve on its own.  The cough is particularly bothersome at night, disrupting her sleep due to throat drainage and a tickling sensation that requires her to sit up. She took Benadryl last night, which provided some relief. No sputum production, fever, or wheezing reported by the patient, although the cough is worse in the evening.  She has been using an albuterol  inhaler, prescribed last year, intermittently. She is unsure about its effectiveness and has not been using it regularly.          Relevant past medical, surgical, family, and social history reviewed and updated as indicated.  Allergies and medications reviewed and updated. Data reviewed: Chart in Epic.   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   Constipation 10/02/2018   HLD (hyperlipidemia) 10/02/2018   Overweight (BMI 25.0-29.9) 10/02/2018   Thyroid  disease    Vaginal Pap smear, abnormal    Vitamin D  deficiency disease 10/02/2018    Past Surgical History:  Procedure Laterality Date   CHOLECYSTECTOMY     LEEP      MOUTH SURGERY      Social History   Socioeconomic History   Marital status: Married    Spouse name: Not on file   Number of children: Not on file   Years of education: Not on file   Highest education level: Not on file  Occupational History   Not on file  Tobacco Use   Smoking status: Never   Smokeless tobacco: Never  Vaping Use   Vaping status: Never Used  Substance and Sexual Activity   Alcohol use: No   Drug use: No   Sexual activity: Not Currently    Birth control/protection: None  Other Topics Concern   Not on file  Social History Narrative   ** Merged History Encounter **       Works as a surveyor, 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 Previously divorced. Married for x 1 year. No children.   Social Drivers of Health   Tobacco Use: Low Risk (12/19/2023)   Patient History    Smoking Tobacco Use: Never    Smokeless Tobacco Use: Never    Passive Exposure: Not on file  Financial Resource Strain: Not on file  Food Insecurity: Not on file  Transportation Needs: Not on file  Physical Activity: Not on file  Stress: Not on file  Social Connections: Not on file  Intimate Partner Violence: Not on file  Depression (PHQ2-9): Low Risk (12/19/2023)   Depression (PHQ2-9)    PHQ-2 Score: 3  Alcohol Screen: Not on file  Housing: Not on file  Utilities: Not on file  Health Literacy: Not on file    Outpatient Encounter Medications as of 12/19/2023  Medication Sig   Albuterol -Budesonide (AIRSUPRA ) 90-80 MCG/ACT AERO Inhale 1 Inhalation into the lungs every 6 (six) hours as needed (cough wheezing).   Cholecalciferol (VITAMIN D -3) 125 MCG (5000 UT) TABS Take 1 tablet by mouth daily.   fluticasone  (FLONASE ) 50 MCG/ACT nasal spray Place 1 spray into both nostrils daily.   levothyroxine  (SYNTHROID ) 112 MCG tablet Take 1 tablet (112 mcg total) by mouth daily.   predniSONE  (DELTASONE ) 20 MG tablet Take 2 tablets (40 mg  total) by mouth daily with breakfast for 5 days.   promethazine -dextromethorphan (PROMETHAZINE -DM) 6.25-15 MG/5ML syrup Take 5 mLs by mouth 4 (four) times daily as needed for cough.   Red Yeast Rice Extract (RED YEAST RICE PO) Take by mouth.   [DISCONTINUED] albuterol  (VENTOLIN  HFA) 108 (90 Base) MCG/ACT inhaler Inhale 2 puffs into the lungs every 6 (six) hours as needed for wheezing or shortness of breath.   [DISCONTINUED] benzonatate  (TESSALON ) 100 MG capsule Take 2 capsules (200 mg total) by mouth 2 (two) times daily as needed for cough.   [DISCONTINUED] predniSONE  (DELTASONE ) 20 MG tablet Take 2 tablets (40 mg total) by mouth daily.   No facility-administered encounter medications on file as of 12/19/2023.    Allergies[1]  Pertinent ROS per HPI, otherwise unremarkable      Objective:  BP 135/89   Pulse 66   Temp 97.8 F (36.6 C)   Ht 5' 3 (1.6 m)   Wt 167 lb 9.6 oz (76 kg)   SpO2 93%   BMI 29.69 kg/m    Wt Readings from Last 3 Encounters:  12/19/23 167 lb 9.6 oz (76 kg)  11/29/23 159 lb 6.3 oz (72.3 kg)  08/30/23 159 lb 6.4 oz (72.3 kg)    Physical Exam Vitals and nursing note reviewed.  Constitutional:      General: She is not in acute distress.    Appearance: Normal appearance. She is well-developed, well-groomed and overweight. She is not ill-appearing, toxic-appearing or diaphoretic.  HENT:     Head: Normocephalic and atraumatic.     Jaw: There is normal jaw occlusion.     Right Ear: Hearing normal.     Left Ear: Hearing normal.     Nose: Nose normal.     Mouth/Throat:     Lips: Pink.     Mouth: Mucous membranes are moist.     Pharynx: Oropharynx is clear. Uvula midline.  Eyes:     General: Lids are normal.     Conjunctiva/sclera: Conjunctivae normal.     Pupils: Pupils are equal, round, and reactive to light.  Neck:     Trachea: Trachea and phonation normal.  Cardiovascular:     Rate and Rhythm: Normal rate and regular rhythm.     Chest Wall: PMI is  not displaced.     Pulses: Normal pulses.     Heart sounds: Normal heart sounds. No murmur heard.    No friction rub. No gallop.  Pulmonary:     Effort: Pulmonary effort is normal. No respiratory distress.     Breath sounds: Wheezing present.     Comments: Dry cough Musculoskeletal:     Cervical back: Normal range of motion and neck supple.     Right lower leg: No edema.     Left lower leg: No edema.  Skin:  General: Skin is warm and dry.     Capillary Refill: Capillary refill takes less than 2 seconds.     Coloration: Skin is not cyanotic, jaundiced or pale.     Findings: No rash.  Neurological:     General: No focal deficit present.     Mental Status: She is alert and oriented to person, place, and time.     Sensory: Sensation is intact.     Motor: Motor function is intact.     Coordination: Coordination is intact.     Gait: Gait is intact.     Deep Tendon Reflexes: Reflexes are normal and symmetric.  Psychiatric:        Attention and Perception: Attention and perception normal.        Mood and Affect: Mood and affect normal.        Speech: Speech normal.        Behavior: Behavior normal. Behavior is cooperative.        Thought Content: Thought content normal.        Cognition and Memory: Cognition and memory normal.        Judgment: Judgment normal.      Results for orders placed or performed in visit on 08/30/23  CBC with Differential/Platelet   Collection Time: 08/30/23  9:01 AM  Result Value Ref Range   WBC 4.5 3.4 - 10.8 x10E3/uL   RBC 4.57 3.77 - 5.28 x10E6/uL   Hemoglobin 13.7 11.1 - 15.9 g/dL   Hematocrit 57.8 65.9 - 46.6 %   MCV 92 79 - 97 fL   MCH 30.0 26.6 - 33.0 pg   MCHC 32.5 31.5 - 35.7 g/dL   RDW 87.4 88.2 - 84.5 %   Platelets 195 150 - 450 x10E3/uL   Neutrophils 50 Not Estab. %   Lymphs 40 Not Estab. %   Monocytes 7 Not Estab. %   Eos 2 Not Estab. %   Basos 1 Not Estab. %   Neutrophils Absolute 2.3 1.4 - 7.0 x10E3/uL   Lymphocytes Absolute  1.8 0.7 - 3.1 x10E3/uL   Monocytes Absolute 0.3 0.1 - 0.9 x10E3/uL   EOS (ABSOLUTE) 0.1 0.0 - 0.4 x10E3/uL   Basophils Absolute 0.0 0.0 - 0.2 x10E3/uL   Immature Granulocytes 0 Not Estab. %   Immature Grans (Abs) 0.0 0.0 - 0.1 x10E3/uL  CMP14+EGFR   Collection Time: 08/30/23  9:01 AM  Result Value Ref Range   Glucose 92 70 - 99 mg/dL   BUN 11 6 - 24 mg/dL   Creatinine, Ser 9.28 0.57 - 1.00 mg/dL   eGFR 896 >40 fO/fpw/8.26   BUN/Creatinine Ratio 15 9 - 23   Sodium 141 134 - 144 mmol/L   Potassium 4.1 3.5 - 5.2 mmol/L   Chloride 102 96 - 106 mmol/L   CO2 21 20 - 29 mmol/L   Calcium 9.4 8.7 - 10.2 mg/dL   Total Protein 6.7 6.0 - 8.5 g/dL   Albumin 4.4 3.8 - 4.9 g/dL   Globulin, Total 2.3 1.5 - 4.5 g/dL   Bilirubin Total 0.3 0.0 - 1.2 mg/dL   Alkaline Phosphatase 70 44 - 121 IU/L   AST 15 0 - 40 IU/L   ALT 11 0 - 32 IU/L  Lipid panel   Collection Time: 08/30/23  9:01 AM  Result Value Ref Range   Cholesterol, Total 230 (H) 100 - 199 mg/dL   Triglycerides 84 0 - 149 mg/dL   HDL 59 >60 mg/dL   VLDL Cholesterol  Cal 15 5 - 40 mg/dL   LDL Chol Calc (NIH) 843 (H) 0 - 99 mg/dL   Chol/HDL Ratio 3.9 0.0 - 4.4 ratio  Thyroid  Panel With TSH   Collection Time: 08/30/23  9:01 AM  Result Value Ref Range   TSH 0.150 (L) 0.450 - 4.500 uIU/mL   T4, Total 9.6 4.5 - 12.0 ug/dL   T3 Uptake Ratio 27 24 - 39 %   Free Thyroxine Index 2.6 1.2 - 4.9  Vitamin D , 25-hydroxy   Collection Time: 08/30/23  9:01 AM  Result Value Ref Range   Vit D, 25-Hydroxy 48.6 30.0 - 100.0 ng/mL  Hepatitis B surface antibody,qualitative   Collection Time: 08/30/23  9:01 AM  Result Value Ref Range   Hep B Surface Ab, Qual Non Reactive   Cologuard   Collection Time: 09/12/23  8:30 AM  Result Value Ref Range   COLOGUARD Negative Negative       Pertinent labs & imaging results that were available during my care of the patient were reviewed by me and considered in my medical decision making.  Assessment & Plan:   Oveda was seen today for bronchitis.  Diagnoses and all orders for this visit:  Acute bronchitis with bronchospasm -     promethazine -dextromethorphan (PROMETHAZINE -DM) 6.25-15 MG/5ML syrup; Take 5 mLs by mouth 4 (four) times daily as needed for cough. -     predniSONE  (DELTASONE ) 20 MG tablet; Take 2 tablets (40 mg total) by mouth daily with breakfast for 5 days. -     Albuterol -Budesonide (AIRSUPRA ) 90-80 MCG/ACT AERO; Inhale 1 Inhalation into the lungs every 6 (six) hours as needed (cough wheezing).     Acute bronchitis with bronchospasm Persistent cough for several weeks, primarily nocturnal, causing sleep disturbances. No fever or wheezing, but mild wheezing on examination. No sputum production. Previous treatment with benoxinate was ineffective. Current symptoms suggest bronchospasm. - Prescribed Airsupra  inhaler (albuterol  with steroid) for use every six hours as needed for cough and wheezing. - Prescribed prednisone  burst for inflammation. - Prescribed promethazine  DM cough syrup for nighttime use to aid sleep. - Advised to avoid daytime use of promethazine  DM due to sedative effects. - Sent prescriptions to [facility]. - Instructed to report any worsening symptoms or new developments.          Continue all other maintenance medications.  Follow up plan: Return if symptoms worsen or fail to improve.   Continue healthy lifestyle choices, including diet (rich in fruits, vegetables, and lean proteins, and low in salt and simple carbohydrates) and exercise (at least 30 minutes of moderate physical activity daily).  Educational handout given for bronchitis   The above assessment and management plan was discussed with the patient. The patient verbalized understanding of and has agreed to the management plan. Patient is aware to call the clinic if they develop any new symptoms or if symptoms persist or worsen. Patient is aware when to return to the clinic for a follow-up visit.  Patient educated on when it is appropriate to go to the emergency department.   Rosaline Bruns, FNP-C [facility] [phone]     [1] No Known Allergies

## 2024-01-22 ENCOUNTER — Other Ambulatory Visit: Payer: Self-pay | Admitting: Family Medicine

## 2024-01-22 DIAGNOSIS — E039 Hypothyroidism, unspecified: Secondary | ICD-10-CM

## 2024-09-02 ENCOUNTER — Encounter: Payer: Self-pay | Admitting: Family Medicine
